# Patient Record
Sex: Female | Born: 1990 | Race: Black or African American | Hispanic: No | Marital: Single | Smoking: Former smoker
Health system: Southern US, Community
[De-identification: ages and names within clinical notes are randomized; demographics above are authoritative.]

## PROBLEM LIST (undated history)

## (undated) DIAGNOSIS — Z789 Other specified health status: Secondary | ICD-10-CM

## (undated) HISTORY — PX: ELBOW SURGERY: SHX618

---

## 2015-12-18 LAB — OB RESULTS CONSOLE HIV ANTIBODY (ROUTINE TESTING): HIV: NONREACTIVE

## 2015-12-18 LAB — OB RESULTS CONSOLE GC/CHLAMYDIA
CHLAMYDIA, DNA PROBE: NEGATIVE
Gonorrhea: NEGATIVE

## 2015-12-18 LAB — OB RESULTS CONSOLE ABO/RH: RH TYPE: POSITIVE

## 2015-12-18 LAB — OB RESULTS CONSOLE ANTIBODY SCREEN: Antibody Screen: NEGATIVE

## 2015-12-18 LAB — OB RESULTS CONSOLE RPR: RPR: NONREACTIVE

## 2015-12-18 LAB — OB RESULTS CONSOLE HEPATITIS B SURFACE ANTIGEN: Hepatitis B Surface Ag: NEGATIVE

## 2015-12-18 LAB — OB RESULTS CONSOLE RUBELLA ANTIBODY, IGM: Rubella: NON-IMMUNE/NOT IMMUNE

## 2016-06-19 LAB — OB RESULTS CONSOLE GBS: STREP GROUP B AG: NEGATIVE

## 2016-07-05 ENCOUNTER — Inpatient Hospital Stay (HOSPITAL_COMMUNITY): Payer: Medicaid Other | Admitting: Anesthesiology

## 2016-07-05 ENCOUNTER — Encounter (HOSPITAL_COMMUNITY): Payer: Self-pay | Admitting: *Deleted

## 2016-07-05 ENCOUNTER — Inpatient Hospital Stay (HOSPITAL_COMMUNITY)
Admission: AD | Admit: 2016-07-05 | Discharge: 2016-07-08 | DRG: 766 | Disposition: A | Discharge status: Home / Self Care | Payer: Medicaid Other | Source: Ambulatory Visit | Attending: Obstetrics and Gynecology | Admitting: Obstetrics and Gynecology

## 2016-07-05 DIAGNOSIS — O99214 Obesity complicating childbirth: Principal | ICD-10-CM | POA: Diagnosis present

## 2016-07-05 DIAGNOSIS — Z87891 Personal history of nicotine dependence: Secondary | ICD-10-CM

## 2016-07-05 DIAGNOSIS — O9902 Anemia complicating childbirth: Secondary | ICD-10-CM | POA: Diagnosis present

## 2016-07-05 DIAGNOSIS — Z8249 Family history of ischemic heart disease and other diseases of the circulatory system: Secondary | ICD-10-CM

## 2016-07-05 DIAGNOSIS — Z6838 Body mass index (BMI) 38.0-38.9, adult: Secondary | ICD-10-CM

## 2016-07-05 DIAGNOSIS — Z3A39 39 weeks gestation of pregnancy: Secondary | ICD-10-CM | POA: Diagnosis not present

## 2016-07-05 DIAGNOSIS — O4290 Premature rupture of membranes, unspecified as to length of time between rupture and onset of labor, unspecified weeks of gestation: Secondary | ICD-10-CM | POA: Diagnosis present

## 2016-07-05 HISTORY — DX: Other specified health status: Z78.9

## 2016-07-05 LAB — TYPE AND SCREEN
ABO/RH(D): O POS
Antibody Screen: NEGATIVE

## 2016-07-05 LAB — CBC
HCT: 31.9 % — ABNORMAL LOW (ref 36.0–46.0)
HEMOGLOBIN: 10.3 g/dL — AB (ref 12.0–15.0)
MCH: 23.6 pg — AB (ref 26.0–34.0)
MCHC: 32.3 g/dL (ref 30.0–36.0)
MCV: 73.2 fL — AB (ref 78.0–100.0)
PLATELETS: 246 10*3/uL (ref 150–400)
RBC: 4.36 MIL/uL (ref 3.87–5.11)
RDW: 14.3 % (ref 11.5–15.5)
WBC: 9.3 10*3/uL (ref 4.0–10.5)

## 2016-07-05 LAB — URINE MICROSCOPIC-ADD ON

## 2016-07-05 LAB — URINALYSIS, ROUTINE W REFLEX MICROSCOPIC
Bilirubin Urine: NEGATIVE
Glucose, UA: NEGATIVE mg/dL
Ketones, ur: 40 mg/dL — AB
NITRITE: NEGATIVE
PROTEIN: NEGATIVE mg/dL
SPECIFIC GRAVITY, URINE: 1.015 (ref 1.005–1.030)
pH: 6.5 (ref 5.0–8.0)

## 2016-07-05 LAB — RPR: RPR Ser Ql: NONREACTIVE

## 2016-07-05 LAB — ABO/RH: ABO/RH(D): O POS

## 2016-07-05 MED ORDER — ACETAMINOPHEN 325 MG PO TABS: 650.0000 mg | ORAL_TABLET | ORAL | Status: DC | PRN

## 2016-07-05 MED ORDER — OXYCODONE-ACETAMINOPHEN 5-325 MG PO TABS: 1.0000 | ORAL_TABLET | ORAL | Status: DC | PRN

## 2016-07-05 MED ORDER — OXYTOCIN 40 UNITS IN LACTATED RINGERS INFUSION - SIMPLE MED
1.0000 m[IU]/min | INTRAVENOUS | Status: DC
  Administered 2016-07-05: 2 m[IU]/min via INTRAVENOUS
  Filled 2016-07-05: qty 1000

## 2016-07-05 MED ORDER — NALBUPHINE HCL 10 MG/ML IJ SOLN
10.0000 mg | INTRAMUSCULAR | Status: DC | PRN
  Administered 2016-07-05 (×2): 10 mg via INTRAVENOUS
  Filled 2016-07-05 (×2): qty 1

## 2016-07-05 MED ORDER — DIPHENHYDRAMINE HCL 50 MG/ML IJ SOLN: 12.5000 mg | INTRAMUSCULAR | Status: DC | PRN

## 2016-07-05 MED ORDER — OXYTOCIN 40 UNITS IN LACTATED RINGERS INFUSION - SIMPLE MED: 2.5000 [IU]/h | INTRAVENOUS | Status: DC

## 2016-07-05 MED ORDER — MISOPROSTOL 50MCG HALF TABLET
50.0000 ug | ORAL_TABLET | ORAL | Status: DC
  Administered 2016-07-05: 50 ug via ORAL
  Filled 2016-07-05: qty 0.5

## 2016-07-05 MED ORDER — FENTANYL 2.5 MCG/ML BUPIVACAINE 1/10 % EPIDURAL INFUSION (WH - ANES)
14.0000 mL/h | INTRAMUSCULAR | Status: DC | PRN
  Administered 2016-07-05 – 2016-07-06 (×2): 14 mL/h via EPIDURAL
  Filled 2016-07-05 (×2): qty 125

## 2016-07-05 MED ORDER — PHENYLEPHRINE 40 MCG/ML (10ML) SYRINGE FOR IV PUSH (FOR BLOOD PRESSURE SUPPORT)
80.0000 ug | PREFILLED_SYRINGE | INTRAVENOUS | Status: DC | PRN
  Filled 2016-07-05: qty 5

## 2016-07-05 MED ORDER — ONDANSETRON HCL 4 MG/2ML IJ SOLN
4.0000 mg | Freq: Four times a day (QID) | INTRAMUSCULAR | Status: DC | PRN
  Filled 2016-07-05: qty 2

## 2016-07-05 MED ORDER — LACTATED RINGERS IV SOLN: 500.0000 mL | Freq: Once | INTRAVENOUS | Status: DC

## 2016-07-05 MED ORDER — SOD CITRATE-CITRIC ACID 500-334 MG/5ML PO SOLN
30.0000 mL | ORAL | Status: DC | PRN
  Filled 2016-07-05: qty 15

## 2016-07-05 MED ORDER — OXYTOCIN BOLUS FROM INFUSION: 500.0000 mL | INTRAVENOUS | Status: DC

## 2016-07-05 MED ORDER — FLEET ENEMA 7-19 GM/118ML RE ENEM: 1.0000 | ENEMA | RECTAL | Status: DC | PRN

## 2016-07-05 MED ORDER — LIDOCAINE HCL (PF) 1 % IJ SOLN
INTRAMUSCULAR | Status: DC | PRN
  Administered 2016-07-05 (×2): 4 mL via EPIDURAL

## 2016-07-05 MED ORDER — PHENYLEPHRINE 40 MCG/ML (10ML) SYRINGE FOR IV PUSH (FOR BLOOD PRESSURE SUPPORT): 80.0000 ug | PREFILLED_SYRINGE | INTRAVENOUS | Status: DC | PRN

## 2016-07-05 MED ORDER — LACTATED RINGERS IV SOLN
INTRAVENOUS | Status: DC
  Administered 2016-07-05 (×3): via INTRAVENOUS

## 2016-07-05 MED ORDER — OXYCODONE-ACETAMINOPHEN 5-325 MG PO TABS: 2.0000 | ORAL_TABLET | ORAL | Status: DC | PRN

## 2016-07-05 MED ORDER — PHENYLEPHRINE 40 MCG/ML (10ML) SYRINGE FOR IV PUSH (FOR BLOOD PRESSURE SUPPORT)
80.0000 ug | PREFILLED_SYRINGE | INTRAVENOUS | Status: DC | PRN
  Filled 2016-07-05: qty 10
  Filled 2016-07-05: qty 5

## 2016-07-05 MED ORDER — EPHEDRINE 5 MG/ML INJ: 10.0000 mg | INTRAVENOUS | Status: DC | PRN

## 2016-07-05 MED ORDER — EPHEDRINE 5 MG/ML INJ
10.0000 mg | INTRAVENOUS | Status: DC | PRN
  Filled 2016-07-05: qty 2

## 2016-07-05 MED ORDER — TERBUTALINE SULFATE 1 MG/ML IJ SOLN: 0.2500 mg | Freq: Once | INTRAMUSCULAR | Status: DC | PRN

## 2016-07-05 MED ORDER — LACTATED RINGERS IV SOLN
500.0000 mL | Freq: Once | INTRAVENOUS | Status: AC
  Administered 2016-07-05: 500 mL via INTRAVENOUS

## 2016-07-05 MED ORDER — LACTATED RINGERS IV SOLN: 500.0000 mL | INTRAVENOUS | Status: DC | PRN

## 2016-07-05 MED ORDER — LIDOCAINE HCL (PF) 1 % IJ SOLN: 30.0000 mL | INTRAMUSCULAR | Status: DC | PRN

## 2016-07-05 NOTE — Anesthesia Pain Management Evaluation Note (Signed)
  CRNA Pain Management Visit Note  Patient: Kimberly Day, 25 y.o., female  "Hello I am a member of the anesthesia team at [facility]. We have an anesthesia team available at all times to provide care throughout the hospital, including epidural management and anesthesia for C-section. I don't know your plan for the delivery whether it a natural birth, water birth, IV sedation, nitrous supplementation, doula or epidural, but we want to meet your pain goals."   1.Was your pain managed to your expectations on prior hospitalizations?   Yes   2.What is your expectation for pain management during this hospitalization?     Epidural  3.How can we help you reach that goal? epidural  Record the patient's initial score and the patient's pain goal.   Pain: 8  Pain Goal: 9 [facility] wants you to be able to say your pain was always managed very well.  Cephus Shelling 07/05/2016

## 2016-07-05 NOTE — Anesthesia Preprocedure Evaluation (Signed)
Anesthesia Evaluation  Patient identified by MRN, date of birth, ID band Patient awake    Reviewed: Allergy & Precautions, NPO status , Patient's Chart, lab work & pertinent test results  Airway Mallampati: II       Dental   Pulmonary former smoker,    breath sounds clear to auscultation       Cardiovascular negative cardio ROS   Rhythm:Regular Rate:Normal     Neuro/Psych negative neurological ROS  negative psych ROS   GI/Hepatic negative GI ROS, Neg liver ROS,   Endo/Other  negative endocrine ROS  Renal/GU negative Renal ROS  negative genitourinary   Musculoskeletal negative musculoskeletal ROS (+)   Abdominal   Peds negative pediatric ROS (+)  Hematology negative hematology ROS (+)   Anesthesia Other Findings   Reproductive/Obstetrics (+) Pregnancy                             Lab Results  Component Value Date   WBC 9.3 07/05/2016   HGB 10.3* 07/05/2016   HCT 31.9* 07/05/2016   MCV 73.2* 07/05/2016   PLT 246 07/05/2016   No results found for: INR, PROTIME   Anesthesia Physical Anesthesia Plan  ASA: III  Anesthesia Plan: Epidural   Post-op Pain Management:    Induction:   Airway Management Planned:   Additional Equipment:   Intra-op Plan:   Post-operative Plan:   Informed Consent: I have reviewed the patients History and Physical, chart, labs and discussed the procedure including the risks, benefits and alternatives for the proposed anesthesia with the patient or authorized representative who has indicated his/her understanding and acceptance.     Plan Discussed with:   Anesthesia Plan Comments:         Anesthesia Quick Evaluation

## 2016-07-05 NOTE — Progress Notes (Addendum)
  Subjective: Denies h/a, visual changes, epigastric pain or difficulty breathing.   Objective: BP 142/89 mmHg  Pulse 96  Temp(Src) 98.3 F (36.8 C) (Oral)  Resp 20  Ht 5\' 3"  (1.6 m)  Wt 97.523 kg (215 lb)  BMI 38.09 kg/m2 Today's Vitals   07/05/16 1725 07/05/16 1803 07/05/16 1836 07/05/16 1855  BP: 126/80  133/84 142/89  Pulse: 89  91 96  Temp:   98.3 F (36.8 C)   TempSrc:   Oral   Resp: 20 18 20 20   Height:      Weight:      PainSc:  9      FHT: BL 140 w/ moderate variability, +accels, earlys UC:   irregular, every 3-4 minutes SVE:   Dilation: 4 Effacement (%): 80 Station: -1 Exam by:: Belenda Cruise RNC @ 18:03 Pitocin at 18 mU/min  Assessment:  IUP at 39.0 wks SROM x 35 1/2 hrs; no s/s of infection Latent labor Elevated BPs; may be pain related Cat 1 FHRT  Plan: Obtain preE labs/urine PCR as needed Place IUPC at next exam  Sherre Scarlet CNM 07/05/2016, 7:13 PM

## 2016-07-05 NOTE — Anesthesia Procedure Notes (Signed)
Epidural Patient location during procedure: OB Start time: 07/05/2016 9:07 PM End time: 07/05/2016 9:14 PM  Staffing Anesthesiologist: Shona Simpson D Performed by: anesthesiologist   Preanesthetic Checklist Completed: patient identified, site marked, surgical consent, pre-op evaluation, timeout performed, IV checked, risks and benefits discussed and monitors and equipment checked  Epidural Patient position: sitting Prep: ChloraPrep Patient monitoring: heart rate, continuous pulse ox and blood pressure Approach: midline Location: L3-L4 Injection technique: LOR saline  Needle:  Needle type: Tuohy  Needle gauge: 17 G Needle length: 9 cm Catheter type: closed end flexible Catheter size: 20 Guage Test dose: negative and 1.5% lidocaine  Assessment Events: blood not aspirated, injection not painful, no injection resistance and no paresthesia  Additional Notes LOR @ 6  Patient identified. Risks/Benefits/Options discussed with patient including but not limited to bleeding, infection, nerve damage, paralysis, failed block, incomplete pain control, headache, blood pressure changes, nausea, vomiting, reactions to medications, itching and postpartum back pain. Confirmed with bedside nurse the patient's most recent platelet count. Confirmed with patient that they are not currently taking any anticoagulation, have any bleeding history or any family history of bleeding disorders. Patient expressed understanding and wished to proceed. All questions were answered. Sterile technique was used throughout the entire procedure. Please see nursing notes for vital signs. Test dose was given through epidural catheter and negative prior to continuing to dose epidural or start infusion. Warning signs of high block given to the patient including shortness of breath, tingling/numbness in hands, complete motor block, or any concerning symptoms with instructions to call for help. Patient was given instructions on  fall risk and not to get out of bed. All questions and concerns addressed with instructions to call with any issues or inadequate analgesia.    Reason for block:procedure for pain

## 2016-07-05 NOTE — Progress Notes (Signed)
OB PN:  S: Pt feeling contractions, but manageable.  S/p IV medication  O: BP 123/71 mmHg  Pulse 95  Temp(Src) 97.4 F (36.3 C) (Oral)  Resp 18  Ht 5\' 3"  (1.6 m)  Wt 97.523 kg (215 lb)  BMI 38.09 kg/m2  FHT: 135bpm, moderate variablity, + accels, no decels Toco: irregular SVE: deferred, last exam @ 1130: 1-2/50/-3  A/P: 25 y.o. G1P0 @ [redacted]w[redacted]d admitted for PROM 1. FWB: Cat. I 2. Labor: continue Pit per protocol Pain: IV medication or epidural upon maternal request GBS: negative  Myna Hidalgo, DO [phone] (pager) [phone] (office)

## 2016-07-05 NOTE — MAU Note (Signed)
Contractions since 0030. Leaking fld off and on all day Friday. Sometimes watery fld and other times mucous. Cerix closed last exam

## 2016-07-05 NOTE — Progress Notes (Addendum)
  Subjective: Becoming more uncomfortable; desires cervical exam and epidural if no change. Denies h/a, visual changes, epigastric pain or difficulty breathing.    Objective: BP 142/89 mmHg  Pulse 96  Temp(Src) 98.3 F (36.8 C) (Oral)  Resp 20  Ht 5\' 3"  (1.6 m)  Wt 97.523 kg (215 lb)  BMI 38.09 kg/m2 Today's Vitals   07/05/16 1803 07/05/16 1836 07/05/16 1855 07/05/16 1949  BP:  133/84 142/89   Pulse:  91 96   Temp:  98.3 F (36.8 C)    TempSrc:  Oral    Resp: 18 20 20    Height:      Weight:      PainSc: 9    9    Maternal pulse > 100 bpm on several occasions.  FHT: BL 140 w/ mod variability, +accels, no decels UC: regular, q 5 min SVE: 4/80/-1 @ 20:18 Pitocin at 18 mU/min IUPC placed w/ ease @ 20:18  Assessment:  IUP at term SROM  X 36 hrs; no s/s of infection Slight elevations in MHR - monitor closely Latent labor GBS neg  Plan: Epidural now; expect decrease in BP and pulse Anticipate SVD Consult as indicated  Sherre Scarlet CNM 07/05/2016, 8:24 PM

## 2016-07-05 NOTE — H&P (Signed)
Kimberly Day is a 25 y.o. female G1P0 @ 39.0 wks (as dated by a 12.2 wk u/s) presents for ROM (clear fluid) since 8 am yesterday morning. +FM and mild, irregular ctxs that started just prior to arrival. Denies VB.  Antepartum course c/b: Obesity (BMI 38.3) Anemia (on daily Iron) FH of breast dz  Maternal Medical History:  Reason for admission: Rupture of membranes.   Contractions: Onset was 1-2 hours ago.   Frequency: irregular.   Perceived severity is moderate.    Fetal activity: Perceived fetal activity is normal.   Last perceived fetal movement was within the past hour.    Prenatal complications: No bleeding.   Prenatal Complications - Diabetes: none.    OB History    Gravida Para Term Preterm AB TAB SAB Ectopic Multiple Living   1              Past Medical History  Diagnosis Date  . Medical history non-contributory    Past Surgical History  Procedure Laterality Date  . Elbow surgery Right    Family History: family history includes Hypertension in her father. Social History:  reports that she has quit smoking. Her smoking use included Cigarettes. She smoked 0.25 packs per day. She does not have any smokeless tobacco history on file. She reports that she does not drink alcohol or use illicit drugs.   Prenatal Transfer Tool  Maternal Diabetes: No Genetic Screening: Normal Maternal Ultrasounds/Referrals: Normal Fetal Ultrasounds or other Referrals:  None Maternal Substance Abuse:  No Significant Maternal Medications:  Meds include: Other: PNV, CitraNatal 90 DHA (algal oil), Concept DHA, Ferrous Sulfate Significant Maternal Lab Results:  Hbg 10.7at 28 wks Other Comments:  Tdap 05/22/16; flu 12/25/15  Review of Systems  Constitutional: Negative for fever, chills, weight loss, malaise/fatigue and diaphoresis.  Eyes: Negative for blurred vision, double vision and photophobia.  Gastrointestinal: Negative for heartburn, vomiting, abdominal pain, diarrhea and  constipation.  Genitourinary: Negative for dysuria, urgency, frequency, hematuria and flank pain.  Neurological: Negative for dizziness, weakness and headaches.    Dilation: 1 Effacement (%): 50 Station: Ballotable Exam by:: Sherre Scarlet CNM Blood pressure 127/81, pulse 79, temperature 98.2 F (36.8 C), temperature source Oral, resp. rate 16, height 5\' 3"  (1.6 m), weight 97.523 kg (215 lb). Maternal Exam:  Uterine Assessment: Contraction strength is firm.  Contraction duration is 35 seconds. Contraction frequency is irregular.   Abdomen: Fundal height is CWD.   Estimated fetal weight is 7 1/4 lbs.   Fetal presentation: vertex  Introitus: Normal vulva. Normal vagina.  Ferning test: positive.  Amniotic fluid character: clear.  Pelvis: adequate for delivery.   Cervix: Cervix evaluated by digital exam.     Fetal Exam Fetal Monitor Review: Mode: fetoscope.   Variability: marked (>25 bpm).   Pattern: no decelerations and early decelerations.       Physical Exam  Constitutional: She appears well-developed and well-nourished. No distress.  HENT:  Head: Normocephalic and atraumatic.  Neck: Normal range of motion. Neck supple.  Cardiovascular: Normal rate, regular rhythm and intact distal pulses.  Exam reveals no gallop and no friction rub.   No murmur heard. Respiratory: Effort normal and breath sounds normal. No respiratory distress.  GI: Soft. Bowel sounds are normal. She exhibits no distension and no mass. There is no tenderness. There is no rebound and no guarding.  Musculoskeletal: Normal range of motion.  Skin: Skin is warm.   Cephalic by Leopold's and limited bedside u/s.  Prenatal labs: ABO, Rh:  O/Positive/-- (01/03 0000) Antibody: Negative (01/03 0000) Rubella: Immune (01/03 0000) RPR: Nonreactive (01/03 0000)  HBsAg: Negative (01/03 0000)  HIV: Non-reactive (01/03 0000)  GBS: Negative (07/06 0000)   Assessment: IUP at 39.0 wks SROM x 23 hours; no s/s of  infection GBS neg Cat 1 FHRT Anemia  Plan: Admit to [facility]. Routine [facility] orders. Pain med/epidural prn. Reviewed options of induction to include po Cytotec and Pitocin. In light of unfavorable cvx, will begin w/ po Cytotec. Reviewed plan of care with patient & mother. They seem to understand these risks and wish to proceed. Monitor for signs of infection. Expect progress and SVD. Dr. Charlotta Newton to assume care at 0700.   Sherre Scarlet 07/05/2016, 7:00 AM

## 2016-07-05 NOTE — Progress Notes (Signed)
OB PN:  S: Pt resting comfortably, no acute complaints  O: BP 121/81 mmHg  Pulse 82  Temp(Src) 98 F (36.7 C) (Oral)  Resp 16  Ht 5\' 3"  (1.6 m)  Wt 97.523 kg (215 lb)  BMI 38.09 kg/m2  FHT: 135bpm, moderate variablity, + accels, NO decels Toco: irregular SVE: deferred, vertex by [place]  A/P: 25 y.o. G1P0 @ [redacted]w[redacted]d admitted for PROM 1. FWB: Cat. I 2. Labor: plan for po cytotec x 1 then transition to Pit Pain: Stadol or epidural upon maternal request GBS: negative  Myna Hidalgo, DO [phone] (pager) [phone] (office)

## 2016-07-06 ENCOUNTER — Encounter (HOSPITAL_COMMUNITY): Payer: Self-pay | Admitting: *Deleted

## 2016-07-06 ENCOUNTER — Encounter (HOSPITAL_COMMUNITY)
Admission: AD | Disposition: A | Discharge status: Home / Self Care | Payer: Self-pay | Source: Ambulatory Visit | Attending: Obstetrics and Gynecology

## 2016-07-06 SURGERY — Surgical Case
Anesthesia: *Unknown

## 2016-07-06 SURGERY — Surgical Case
Anesthesia: Epidural

## 2016-07-06 MED ORDER — LACTATED RINGERS IV SOLN
INTRAVENOUS | Status: DC | PRN
  Administered 2016-07-06 (×2): via INTRAVENOUS

## 2016-07-06 MED ORDER — ONDANSETRON HCL 4 MG/2ML IJ SOLN
INTRAMUSCULAR | Status: DC | PRN
  Administered 2016-07-06: 4 mg via INTRAVENOUS

## 2016-07-06 MED ORDER — SODIUM BICARBONATE 8.4 % IV SOLN
INTRAVENOUS | Status: DC | PRN
  Administered 2016-07-06: 5 mL via EPIDURAL

## 2016-07-06 MED ORDER — NALBUPHINE HCL 10 MG/ML IJ SOLN: 5.0000 mg | INTRAMUSCULAR | Status: DC | PRN

## 2016-07-06 MED ORDER — ONDANSETRON HCL 4 MG/2ML IJ SOLN
4.0000 mg | Freq: Three times a day (TID) | INTRAMUSCULAR | Status: DC | PRN
  Administered 2016-07-06: 4 mg via INTRAVENOUS

## 2016-07-06 MED ORDER — MEPERIDINE HCL 25 MG/ML IJ SOLN: 6.2500 mg | INTRAMUSCULAR | Status: DC | PRN

## 2016-07-06 MED ORDER — ACETAMINOPHEN 325 MG PO TABS: 650.0000 mg | ORAL_TABLET | ORAL | Status: DC | PRN

## 2016-07-06 MED ORDER — MEPERIDINE HCL 25 MG/ML IJ SOLN
INTRAMUSCULAR | Status: AC
  Filled 2016-07-06: qty 1

## 2016-07-06 MED ORDER — DIPHENHYDRAMINE HCL 25 MG PO CAPS
25.0000 mg | ORAL_CAPSULE | ORAL | Status: DC | PRN
  Filled 2016-07-06 (×2): qty 1

## 2016-07-06 MED ORDER — OXYTOCIN 10 UNIT/ML IJ SOLN
INTRAVENOUS | Status: DC | PRN
  Administered 2016-07-06: 40 [IU] via INTRAVENOUS

## 2016-07-06 MED ORDER — ZOLPIDEM TARTRATE 5 MG PO TABS: 5.0000 mg | ORAL_TABLET | Freq: Every evening | ORAL | Status: DC | PRN

## 2016-07-06 MED ORDER — SCOPOLAMINE 1 MG/3DAYS TD PT72
MEDICATED_PATCH | TRANSDERMAL | Status: AC
  Filled 2016-07-06: qty 1

## 2016-07-06 MED ORDER — KETOROLAC TROMETHAMINE 30 MG/ML IJ SOLN
30.0000 mg | Freq: Four times a day (QID) | INTRAMUSCULAR | Status: AC | PRN
  Administered 2016-07-06: 30 mg via INTRAMUSCULAR

## 2016-07-06 MED ORDER — NALBUPHINE HCL 10 MG/ML IJ SOLN: 5.0000 mg | Freq: Once | INTRAMUSCULAR | Status: DC | PRN

## 2016-07-06 MED ORDER — SIMETHICONE 80 MG PO CHEW: 80.0000 mg | CHEWABLE_TABLET | ORAL | Status: DC | PRN

## 2016-07-06 MED ORDER — CEFAZOLIN SODIUM-DEXTROSE 2-4 GM/100ML-% IV SOLN: 2.0000 g | Freq: Once | INTRAVENOUS | Status: DC

## 2016-07-06 MED ORDER — DIPHENHYDRAMINE HCL 25 MG PO CAPS
25.0000 mg | ORAL_CAPSULE | Freq: Four times a day (QID) | ORAL | Status: DC | PRN
  Administered 2016-07-06: 25 mg via ORAL

## 2016-07-06 MED ORDER — OXYTOCIN 10 UNIT/ML IJ SOLN
INTRAMUSCULAR | Status: AC
  Filled 2016-07-06: qty 4

## 2016-07-06 MED ORDER — KETOROLAC TROMETHAMINE 30 MG/ML IJ SOLN
INTRAMUSCULAR | Status: AC
  Filled 2016-07-06: qty 1

## 2016-07-06 MED ORDER — PHENYLEPHRINE HCL 10 MG/ML IJ SOLN
INTRAMUSCULAR | Status: DC | PRN
  Administered 2016-07-06 (×2): 80 ug via INTRAVENOUS
  Administered 2016-07-06: 40 ug via INTRAVENOUS

## 2016-07-06 MED ORDER — WITCH HAZEL-GLYCERIN EX PADS: 1.0000 "application " | MEDICATED_PAD | CUTANEOUS | Status: DC | PRN

## 2016-07-06 MED ORDER — SENNOSIDES-DOCUSATE SODIUM 8.6-50 MG PO TABS
2.0000 | ORAL_TABLET | ORAL | Status: DC
  Administered 2016-07-06 – 2016-07-07 (×2): 2 via ORAL
  Filled 2016-07-06 (×2): qty 2

## 2016-07-06 MED ORDER — METOCLOPRAMIDE HCL 5 MG/ML IJ SOLN
INTRAMUSCULAR | Status: DC | PRN
  Administered 2016-07-06: 10 mg via INTRAVENOUS

## 2016-07-06 MED ORDER — SIMETHICONE 80 MG PO CHEW
80.0000 mg | CHEWABLE_TABLET | ORAL | Status: DC
  Administered 2016-07-06 – 2016-07-07 (×2): 80 mg via ORAL
  Filled 2016-07-06 (×2): qty 1

## 2016-07-06 MED ORDER — CEFAZOLIN SODIUM-DEXTROSE 2-4 GM/100ML-% IV SOLN
INTRAVENOUS | Status: AC
Start: 2016-07-06 — End: 2016-07-06
  Filled 2016-07-06: qty 100

## 2016-07-06 MED ORDER — SIMETHICONE 80 MG PO CHEW
80.0000 mg | CHEWABLE_TABLET | Freq: Three times a day (TID) | ORAL | Status: DC
  Administered 2016-07-07 – 2016-07-08 (×3): 80 mg via ORAL
  Filled 2016-07-06 (×3): qty 1

## 2016-07-06 MED ORDER — NALOXONE HCL 2 MG/2ML IJ SOSY
1.0000 ug/kg/h | PREFILLED_SYRINGE | INTRAMUSCULAR | Status: DC | PRN
Start: 2016-07-06 — End: 2016-07-08
  Filled 2016-07-06: qty 2

## 2016-07-06 MED ORDER — SCOPOLAMINE 1 MG/3DAYS TD PT72: 1.0000 | MEDICATED_PATCH | Freq: Once | TRANSDERMAL | Status: DC

## 2016-07-06 MED ORDER — OXYTOCIN 40 UNITS IN LACTATED RINGERS INFUSION - SIMPLE MED: 2.5000 [IU]/h | INTRAVENOUS | Status: AC

## 2016-07-06 MED ORDER — SODIUM CHLORIDE 0.9% FLUSH: 3.0000 mL | INTRAVENOUS | Status: DC | PRN

## 2016-07-06 MED ORDER — MEPERIDINE HCL 25 MG/ML IJ SOLN
INTRAMUSCULAR | Status: DC | PRN
  Administered 2016-07-06: 12.5 mg via INTRAVENOUS

## 2016-07-06 MED ORDER — DEXAMETHASONE SODIUM PHOSPHATE 4 MG/ML IJ SOLN
INTRAMUSCULAR | Status: AC
  Filled 2016-07-06: qty 1

## 2016-07-06 MED ORDER — DIBUCAINE 1 % RE OINT: 1.0000 "application " | TOPICAL_OINTMENT | RECTAL | Status: DC | PRN

## 2016-07-06 MED ORDER — MENTHOL 3 MG MT LOZG: 1.0000 | LOZENGE | OROMUCOSAL | Status: DC | PRN

## 2016-07-06 MED ORDER — KETOROLAC TROMETHAMINE 30 MG/ML IJ SOLN: 30.0000 mg | Freq: Four times a day (QID) | INTRAMUSCULAR | Status: AC | PRN

## 2016-07-06 MED ORDER — PRENATAL MULTIVITAMIN CH
1.0000 | ORAL_TABLET | Freq: Every day | ORAL | Status: DC
  Administered 2016-07-07 – 2016-07-08 (×2): 1 via ORAL
  Filled 2016-07-06 (×2): qty 1

## 2016-07-06 MED ORDER — CEFAZOLIN SODIUM-DEXTROSE 2-3 GM-% IV SOLR
INTRAVENOUS | Status: DC | PRN
  Administered 2016-07-06: 2 g via INTRAVENOUS

## 2016-07-06 MED ORDER — LIDOCAINE-EPINEPHRINE 2 %-1:100000 IJ SOLN
INTRAMUSCULAR | Status: DC | PRN
  Administered 2016-07-06: 5 mL via INTRADERMAL

## 2016-07-06 MED ORDER — MORPHINE SULFATE (PF) 0.5 MG/ML IJ SOLN
INTRAMUSCULAR | Status: DC | PRN
  Administered 2016-07-06: 1 mg via INTRAVENOUS
  Administered 2016-07-06: 4 mg via EPIDURAL

## 2016-07-06 MED ORDER — LACTATED RINGERS IV SOLN
INTRAVENOUS | Status: DC
  Administered 2016-07-06 (×2): via INTRAVENOUS

## 2016-07-06 MED ORDER — MORPHINE SULFATE (PF) 0.5 MG/ML IJ SOLN
INTRAMUSCULAR | Status: AC
  Filled 2016-07-06: qty 10

## 2016-07-06 MED ORDER — FAMOTIDINE IN NACL 20-0.9 MG/50ML-% IV SOLN
20.0000 mg | Freq: Once | INTRAVENOUS | Status: AC
  Administered 2016-07-06: 20 mg via INTRAVENOUS
  Filled 2016-07-06: qty 50

## 2016-07-06 MED ORDER — NALOXONE HCL 0.4 MG/ML IJ SOLN: 0.4000 mg | INTRAMUSCULAR | Status: DC | PRN

## 2016-07-06 MED ORDER — ONDANSETRON HCL 4 MG/2ML IJ SOLN
INTRAMUSCULAR | Status: AC
  Filled 2016-07-06: qty 2

## 2016-07-06 MED ORDER — SCOPOLAMINE 1 MG/3DAYS TD PT72
MEDICATED_PATCH | TRANSDERMAL | Status: DC | PRN
  Administered 2016-07-06: 1 via TRANSDERMAL

## 2016-07-06 MED ORDER — FENTANYL CITRATE (PF) 100 MCG/2ML IJ SOLN
INTRAMUSCULAR | Status: DC | PRN
Start: 2016-07-06 — End: 2016-07-06
  Administered 2016-07-06: 100 ug via INTRAVENOUS

## 2016-07-06 MED ORDER — DEXAMETHASONE SODIUM PHOSPHATE 4 MG/ML IJ SOLN
INTRAMUSCULAR | Status: DC | PRN
  Administered 2016-07-06: 4 mg via INTRAVENOUS

## 2016-07-06 MED ORDER — PROMETHAZINE HCL 25 MG/ML IJ SOLN: 6.2500 mg | INTRAMUSCULAR | Status: DC | PRN

## 2016-07-06 MED ORDER — DIPHENHYDRAMINE HCL 50 MG/ML IJ SOLN: 12.5000 mg | INTRAMUSCULAR | Status: DC | PRN

## 2016-07-06 MED ORDER — LACTATED RINGERS IV SOLN
INTRAVENOUS | Status: DC
  Administered 2016-07-06: 05:00:00 via INTRAVENOUS

## 2016-07-06 MED ORDER — IBUPROFEN 600 MG PO TABS
600.0000 mg | ORAL_TABLET | Freq: Four times a day (QID) | ORAL | Status: DC
  Administered 2016-07-06 – 2016-07-08 (×9): 600 mg via ORAL
  Filled 2016-07-06 (×9): qty 1

## 2016-07-06 MED ORDER — FENTANYL CITRATE (PF) 100 MCG/2ML IJ SOLN
INTRAMUSCULAR | Status: AC
  Filled 2016-07-06: qty 2

## 2016-07-06 MED ORDER — FENTANYL CITRATE (PF) 100 MCG/2ML IJ SOLN: 25.0000 ug | INTRAMUSCULAR | Status: DC | PRN

## 2016-07-06 MED ORDER — COCONUT OIL OIL: 1.0000 "application " | TOPICAL_OIL | Status: DC | PRN

## 2016-07-06 MED ORDER — TETANUS-DIPHTH-ACELL PERTUSSIS 5-2.5-18.5 LF-MCG/0.5 IM SUSP: 0.5000 mL | Freq: Once | INTRAMUSCULAR | Status: DC

## 2016-07-06 MED ORDER — CEFAZOLIN SODIUM-DEXTROSE 2-4 GM/100ML-% IV SOLN
2.0000 g | INTRAVENOUS | Status: DC
  Filled 2016-07-06: qty 100

## 2016-07-06 SURGICAL SUPPLY — 36 items
BARRIER ADHS 3X4 INTERCEED (GAUZE/BANDAGES/DRESSINGS) ×2 IMPLANT
BENZOIN TINCTURE PRP APPL 2/3 (GAUZE/BANDAGES/DRESSINGS) ×2 IMPLANT
CHLORAPREP W/TINT 26ML (MISCELLANEOUS) ×2 IMPLANT
CLAMP CORD UMBIL (MISCELLANEOUS) IMPLANT
CLOTH BEACON ORANGE TIMEOUT ST (SAFETY) ×2 IMPLANT
DRSG OPSITE POSTOP 4X10 (GAUZE/BANDAGES/DRESSINGS) ×2 IMPLANT
ELECT REM PT RETURN 9FT ADLT (ELECTROSURGICAL) ×2
ELECTRODE REM PT RTRN 9FT ADLT (ELECTROSURGICAL) ×1 IMPLANT
EXTRACTOR VACUUM KIWI (MISCELLANEOUS) IMPLANT
GLOVE BIOGEL PI IND STRL 6.5 (GLOVE) ×1 IMPLANT
GLOVE BIOGEL PI IND STRL 7.0 (GLOVE) ×2 IMPLANT
GLOVE BIOGEL PI INDICATOR 6.5 (GLOVE) ×1
GLOVE BIOGEL PI INDICATOR 7.0 (GLOVE) ×2
GLOVE ECLIPSE 6.5 STRL STRAW (GLOVE) ×2 IMPLANT
GOWN STRL REUS W/TWL LRG LVL3 (GOWN DISPOSABLE) ×6 IMPLANT
KIT ABG SYR 3ML LUER SLIP (SYRINGE) IMPLANT
LIQUID BAND (GAUZE/BANDAGES/DRESSINGS) IMPLANT
NEEDLE HYPO 25X5/8 SAFETYGLIDE (NEEDLE) IMPLANT
NS IRRIG 1000ML POUR BTL (IV SOLUTION) ×2 IMPLANT
PACK C SECTION WH (CUSTOM PROCEDURE TRAY) ×2 IMPLANT
PAD ABD 7.5X8 STRL (GAUZE/BANDAGES/DRESSINGS) ×2 IMPLANT
PAD OB MATERNITY 4.3X12.25 (PERSONAL CARE ITEMS) ×2 IMPLANT
PENCIL SMOKE EVAC W/HOLSTER (ELECTROSURGICAL) ×2 IMPLANT
RTRCTR C-SECT PINK 25CM LRG (MISCELLANEOUS) ×2 IMPLANT
STRIP CLOSURE SKIN 1/2X4 (GAUZE/BANDAGES/DRESSINGS) ×2 IMPLANT
SUT PLAIN 0 NONE (SUTURE) IMPLANT
SUT PLAIN 2 0 XLH (SUTURE) IMPLANT
SUT VIC AB 0 CT1 27 (SUTURE) ×2
SUT VIC AB 0 CT1 27XBRD ANBCTR (SUTURE) ×2 IMPLANT
SUT VIC AB 0 CTX 36 (SUTURE) ×3
SUT VIC AB 0 CTX36XBRD ANBCTRL (SUTURE) ×3 IMPLANT
SUT VIC AB 2-0 CT1 27 (SUTURE) ×1
SUT VIC AB 2-0 CT1 TAPERPNT 27 (SUTURE) ×1 IMPLANT
SUT VIC AB 4-0 KS 27 (SUTURE) ×2 IMPLANT
TOWEL OR 17X24 6PK STRL BLUE (TOWEL DISPOSABLE) ×2 IMPLANT
TRAY FOLEY CATH SILVER 14FR (SET/KITS/TRAYS/PACK) IMPLANT

## 2016-07-06 NOTE — Addendum Note (Signed)
Addendum  created 07/06/16 1318 by Algis Greenhouse, CRNA   Charge Capture section accepted, Sign clinical note

## 2016-07-06 NOTE — Lactation Note (Signed)
This note was copied from a baby's chart. Lactation Consultation Note  Patient Name: Kimberly Day Today's Date: 07/06/2016 Reason for consult: Initial assessment RN had assisted Mom with latching baby to left breast. Baby demonstrating good suckling bursts with swallowing motions noted. Mom denies discomfort. Mom reports baby is not latching to right breast. Demonstrated hand expression and using hand pump to help with latch. Basic teaching reviewed, encouraged to continue to BF with feeding ques. Lactation brochure left for review, advised of OP services and support group. LC left phone number for Mom to call with next feeding for assist with latch on right breast.   Maternal Data Has patient been taught Hand Expression?: Yes Does the patient have breastfeeding experience prior to this delivery?: No  Feeding Feeding Type: Breast Fed  LATCH Score/Interventions Latch: Repeated attempts needed to sustain latch, nipple held in mouth throughout feeding, stimulation needed to elicit sucking reflex. Intervention(s): Adjust position;Assist with latch;Breast compression  Audible Swallowing: None  Type of Nipple: Flat (erect with stim) Intervention(s): Hand pump  Comfort (Breast/Nipple): Soft / non-tender     Hold (Positioning): Assistance needed to correctly position infant at breast and maintain latch. Intervention(s): Breastfeeding basics reviewed;Support Pillows;Skin to skin  LATCH Score: 5  Lactation Tools Discussed/Used WIC Program: Yes   Consult Status Consult Status: Follow-up Date: 07/06/16 Follow-up type: In-patient    Alfred Levins 07/06/2016, 5:37 PM

## 2016-07-06 NOTE — Lactation Note (Signed)
This note was copied from a baby's chart. Lactation Consultation Note  Patient Name: Kimberly Day Date: 07/06/2016 Reason for consult: Follow-up assessment Mom called for assist with latching baby to right breast. Mom's right nipple is flat, Mom pre-pumped with hand pump, nipple compressible. After several attempts in laid back position using breast compression baby was able to latch and sustain the latch demonstrating good suckling bursts with some swallows noted. Mom denied discomfort. Mom has colostrum present with hand expression/pre-pumping. Mom to continue to BF with feeding ques. Alternate breast each feeding, ask for assist as needed to help baby latch.   Maternal Data    Feeding Feeding Type: Breast Fed  LATCH Score/Interventions Latch: Repeated attempts needed to sustain latch, nipple held in mouth throughout feeding, stimulation needed to elicit sucking reflex. (right breast) Intervention(s): Adjust position;Assist with latch;Breast massage;Breast compression  Audible Swallowing: A few with stimulation  Type of Nipple: Flat (right nipple flat/compressible) Intervention(s): Hand pump  Comfort (Breast/Nipple): Soft / non-tender     Hold (Positioning): Assistance needed to correctly position infant at breast and maintain latch.  LATCH Score: 6  Lactation Tools Discussed/Used Tools: Pump Breast pump type: Manual   Consult Status Consult Status: Follow-up Date: 07/07/16 Follow-up type: In-patient    Alfred Levins 07/06/2016, 11:23 PM

## 2016-07-06 NOTE — Op Note (Signed)
PreOp Diagnosis:  1) Intrauterine pregnancy @ [redacted]w[redacted]d  2) Arrest of dilation PostOp Diagnosis: same Procedure: Primary LTCS Surgeon: Dr. Myna Hidalgo Assistant: Sherre Scarlet, CNM Anesthesia: epidural Complications: none EBL: 600cc UOP: 50cc Fluids: 2200cc  Findings: Female infant from vertex presentation, normal uterus, tubes and ovaries bilaterally.  PROCEDURE:  Informed consent was obtained from the patient with risks, benefits, complications, treatment options, and expected outcomes discussed with the patient.  The patient concurred with the proposed plan, giving informed consent with form signed.   The patient was taken to Operating Room, and identified with the procedure verified as C-Section Delivery with Time Out. The patient was prepped and draped in the usual sterile fashion. A Pfannenstiel incision was made and carried down through the subcutaneous tissue to the fascia. The fascia was incised in the midline and extended transversely. The superior aspect of the fascial incision was grasped with Kochers elevated and the underlying muscle dissected off. The inferior aspect of the facial incision was in similar fashion, grasped elevated and rectus muscles dissected off. The peritoneum was identified and entered. Peritoneal incision was extended longitudinally. The Alexis retractor was inserted.  The utero-vesical peritoneal reflection was identified and incised transversely with the [facility] scissors, the incision extended laterally, the bladder flap created digitally. A low transverse uterine incision was made and the infants head delivered atraumatically. After the umbilical cord was clamped and cut cord blood was obtained for evaluation.   The placenta was removed intact and appeared normal. The uterine outline, tubes and ovaries appeared normal. The uterine incision was closed with running locked sutures of 0 Vicryl and a second layer of the same stitch was used in an imbricating  fashion.  Excellent hemostasis was obtained.  The pericolic gutters were then cleared of all clots and debris. Interceed was placed over the incision.  Peritoneum was closed in a running fashion. The fascia was then reapproximated with running sutures of 0 Vicryl.  The skin was closed with 4-0 vicryl in a subcuticular fashion.  Instrument, sponge, and needle counts were correct prior the abdominal closure and at the conclusion of the case. The patient was taken to recovery in stable condition.  Myna Hidalgo, DO [phone] (pager) [phone] (office)

## 2016-07-06 NOTE — Progress Notes (Signed)
At bedside to discuss primary C-section for arrest of dilation.  Despite Pitocin and IUPC with monitoring of contractions, the patient has not had any further cervical dilation for over 6 hours.  Risk benefits and alternatives of cesarean section were discussed with the patient including but not limited to infection, bleeding, damage to bowel , bladder and baby with the need for further surgery. Pt voiced understanding and desires to proceed.   Myna Hidalgo, DO [phone] (pager) [phone] (office)

## 2016-07-06 NOTE — Progress Notes (Addendum)
  Subjective: Feels rested. Has occasional vaginal pressure, o/w comfortable w/ epidural. +FM. No VB. Continues to leak clear fluid.  Objective: BP 117/68   Pulse 92   Temp 98.4 F (36.9 C) (Oral)   Resp 18   Ht 5\' 3"  (1.6 m)   Wt 97.5 kg (215 lb)   SpO2 100%   BMI 38.09 kg/m  No intake/output data recorded. No intake/output data recorded. Today's Vitals   07/06/16 0331 07/06/16 0401 07/06/16 0431 07/06/16 0501  BP: 128/64 125/67 115/62 117/68  Pulse: 92 83 86 92  Resp:      Temp:      TempSrc:      SpO2:      Weight:      Height:      PainSc:       FHT: Category 1 at present UC:   irregular, every 3-4 minutes SVE: 4/thick/high    Pitocin at 19 mU/min - off at decision time. Reached 30 mU/min, then reduced in half, continuing 2x2 regimen. Max MVUs 150.  Assessment:  IUP at 39.1 wks. FTP-arrest of dilation Approaching 48-hr SROM, however no s/s of infection. Max temp 99.6. GBS neg.  Plan: Contacted Dr. Charlotta Newton re: status - concurs w/ proceeding w/ c-section. Risks, Benefits, Alternatives including but not limited to bleeding, infection and injury were discussed with the patient. Patient verbalized understanding and consent signed and witnessed. Ancef 2 grams to OR. Prep for surgery.  Sherre Scarlet CNM 07/06/2016, 5:43 AM

## 2016-07-06 NOTE — Anesthesia Postprocedure Evaluation (Signed)
Anesthesia Post Note  Patient: Kimberly Day  Procedure(s) Performed: Procedure(s) (LRB): CESAREAN SECTION (N/A)  Patient location during evaluation: Mother Baby Anesthesia Type: Spinal Level of consciousness: awake Pain management: satisfactory to patient Vital Signs Assessment: post-procedure vital signs reviewed and stable Respiratory status: spontaneous breathing Cardiovascular status: stable Anesthetic complications: no     Last Vitals:  Vitals:   07/06/16 1044 07/06/16 1215  BP: 132/71 120/72  Pulse:    Resp: 20 20  Temp: 37.2 C 36.7 C    Last Pain:  Vitals:   07/06/16 1215  TempSrc: Oral  PainSc:    Pain Goal: Patients Stated Pain Goal: 0 (07/05/16 0600)               Cephus Shelling

## 2016-07-06 NOTE — Anesthesia Postprocedure Evaluation (Signed)
Anesthesia Post Note  Patient: Kimberly Day  Procedure(s) Performed: Procedure(s) (LRB): CESAREAN SECTION (N/A)  Patient location during evaluation: PACU Anesthesia Type: Epidural Level of consciousness: oriented and awake and alert Pain management: pain level controlled Vital Signs Assessment: post-procedure vital signs reviewed and stable Respiratory status: spontaneous breathing, respiratory function stable and patient connected to nasal cannula oxygen Cardiovascular status: blood pressure returned to baseline and stable Postop Assessment: no headache, no backache and epidural receding Anesthetic complications: no     Last Vitals:  Vitals:   07/06/16 0800 07/06/16 0825  BP: (!) 125/93 (!) 142/82  Pulse: 86 87  Resp: 15 16  Temp:  37 C    Last Pain:  Vitals:   07/06/16 0825  TempSrc: Oral  PainSc:    Pain Goal: Patients Stated Pain Goal: 0 (07/05/16 0600)               Shelton Silvas

## 2016-07-06 NOTE — Transfer of Care (Signed)
Immediate Anesthesia Transfer of Care Note  Patient: Kimberly Day  Procedure(s) Performed: Procedure(s): CESAREAN SECTION (N/A)  Patient Location: PACU  Anesthesia Type:Epidural  Level of Consciousness: awake  Airway & Oxygen Therapy: Patient Spontanous Breathing  Post-op Assessment: Report given to RN  Post vital signs: Reviewed and stable  Last Vitals:  Vitals:   07/06/16 0431 07/06/16 0501  BP: 115/62 117/68  Pulse: 86 92  Resp:    Temp:      Last Pain:  Vitals:   07/05/16 2331  TempSrc:   PainSc: Asleep      Patients Stated Pain Goal: 0 (07/05/16 0600)  Complications: No apparent anesthesia complications

## 2016-07-07 LAB — CBC
HEMATOCRIT: 25.7 % — AB (ref 36.0–46.0)
Hemoglobin: 8.3 g/dL — ABNORMAL LOW (ref 12.0–15.0)
MCH: 23.9 pg — AB (ref 26.0–34.0)
MCHC: 32.3 g/dL (ref 30.0–36.0)
MCV: 74.1 fL — ABNORMAL LOW (ref 78.0–100.0)
Platelets: 227 10*3/uL (ref 150–400)
RBC: 3.47 MIL/uL — ABNORMAL LOW (ref 3.87–5.11)
RDW: 14.7 % (ref 11.5–15.5)
WBC: 11.6 10*3/uL — ABNORMAL HIGH (ref 4.0–10.5)

## 2016-07-07 MED ORDER — FERROUS SULFATE 325 (65 FE) MG PO TABS
325.0000 mg | ORAL_TABLET | Freq: Every day | ORAL | Status: DC
  Administered 2016-07-07 – 2016-07-08 (×2): 325 mg via ORAL
  Filled 2016-07-07 (×2): qty 1

## 2016-07-07 MED ORDER — IBUPROFEN 600 MG PO TABS: 600.0000 mg | ORAL_TABLET | Freq: Four times a day (QID) | ORAL | 0 refills | Status: DC

## 2016-07-07 MED ORDER — PRENATAL 27-0.8 MG PO TABS: 1.0000 | ORAL_TABLET | Freq: Every day | ORAL | 11 refills | Status: AC

## 2016-07-07 NOTE — Progress Notes (Signed)
UR chart review completed.  

## 2016-07-07 NOTE — Anesthesia Postprocedure Evaluation (Signed)
Anesthesia Post Note  Patient: [place] Gage  Procedure(s) Performed: Procedure(s) (LRB): CESAREAN SECTION (N/A)  Patient location during evaluation: Mother Baby Anesthesia Type: Epidural Level of consciousness: awake, awake and alert, oriented and patient cooperative Pain management: pain level controlled Vital Signs Assessment: post-procedure vital signs reviewed and stable Respiratory status: spontaneous breathing, nonlabored ventilation and respiratory function stable Cardiovascular status: stable Postop Assessment: no headache, patient able to bend at knees, no backache and no signs of nausea or vomiting Anesthetic complications: no     Last Vitals:  Vitals:   07/07/16 0700 07/07/16 0815  BP: 125/85 102/64  Pulse: (!) 58 78  Resp: 18 18  Temp: 36.5 C 36.6 C    Last Pain:  Vitals:   07/07/16 0815  TempSrc: Oral  PainSc:    Pain Goal: Patients Stated Pain Goal: 0 (07/06/16 2334)               Embry Manrique L

## 2016-07-07 NOTE — Addendum Note (Signed)
Addendum  created 07/07/16 1327 by Yolonda Kida, CRNA   Sign clinical note

## 2016-07-07 NOTE — Progress Notes (Addendum)
[place], [place] DOB: 12/26/1980  Subjective: Postpartum Day 1: Cesarean Delivery Patient reports no complaints. Tolerating regular diet.  Ambulating and voiding without difficulty.  Pain well controlled. Scant lochia.     Objective: Vital signs in last 24 hours: Temp:  [97.7 F (36.5 C)-98.4 F (36.9 C)] 97.8 F (36.6 C) (07/24 0815) Pulse Rate:  [58-78] 78 (07/24 0815) Resp:  [18-20] 18 (07/24 0815) BP: (102-125)/(64-85) 102/64 (07/24 0815) SpO2:  [100 %] 100 % (07/23 2030)  Physical Exam:  General: alert and cooperative Lochia: appropriate Uterine Fundus: firm Incision: Small dried serosangenous drainage at center of incision.   DVT Evaluation: No evidence of DVT seen on physical exam. Calf/Ankle edema is present.   Recent Labs  07/05/16 0620 07/07/16 0513  HGB 10.3* 8.3*  HCT 31.9* 25.7*    Assessment/Plan: Status post Cesarean section. Doing well postoperatively.  Continue current care. May desire early discharge tomorrow. Unsure about birth control, all options discussed.  Out of bed and ambulation encouraged.   Asymptomatic anemia, iron tabs started. Rubella Immune, no need for MMR vaccine.       [facility] 07/07/2016, 2:24 PM

## 2016-07-08 MED ORDER — FERROUS SULFATE 325 (65 FE) MG PO TABS: 325.0000 mg | ORAL_TABLET | Freq: Every day | ORAL | 3 refills | Status: DC

## 2016-07-08 MED ORDER — MEASLES, MUMPS & RUBELLA VAC ~~LOC~~ INJ
0.5000 mL | INJECTION | Freq: Once | SUBCUTANEOUS | Status: DC
  Filled 2016-07-08: qty 0.5

## 2016-07-08 NOTE — Discharge Summary (Signed)
Obstetric Discharge Summary Reason for Admission: onset of labor Prenatal Procedures: NST and ultrasound Intrapartum Procedures: cesarean: low cervical, transverse Postpartum Procedures: none Complications-Operative and Postpartum: none Hemoglobin  Date Value Ref Range Status  07/07/2016 8.3 (L) 12.0 - 15.0 g/dL Final   HCT  Date Value Ref Range Status  07/07/2016 25.7 (L) 36.0 - 46.0 % Final    Physical Exam:  General: alert and cooperative Lochia: appropriate Uterine Fundus: firm Incision: healing well, no significant drainage, no dehiscence DVT Evaluation: No evidence of DVT seen on physical exam.  Discharge Diagnoses: Term Pregnancy-delivered.  Pt presented with labor and then had CS by Dr Charlotta Newton  For FTD.    Discharge Information: Date: 07/08/2016 Activity: pelvic rest Diet: routine Medications: PNV, Ibuprofen and Iron Condition: stable Instructions: refer to practice specific booklet Discharge to: home   Newborn Data: Live born female  Birth Weight: 6 lb 5.8 oz (2885 g) APGAR: 10, 10  Home with mother.  Kimberly Day A 07/08/2016, 1:39 PM

## 2016-07-08 NOTE — Progress Notes (Signed)
Patient is formula feeding and has not put the baby to breast for the last feedings. She reports having sore nipples and latch was painful. Offered to assist mother with positioning and latching, discussed management of lactation, supply and demand and nipple preference for newborn when supplementing. Presently mother states she does not want to latch her baby and is leaning towards exclusively formula feeding. Patient discussed pumping. Patient is active in WIC. Encouraged to call for assistance.  

## 2016-07-08 NOTE — Discharge Instructions (Signed)
Cesarean Delivery Cesarean delivery is the birth of a baby through a cut (incision) in the abdomen and womb (uterus).  LET YOUR HEALTH CARE PROVIDER KNOW ABOUT:  All medicines you are taking, including vitamins, herbs, eye drops, creams, and over-the-counter medicines.  Previous problems you or members of your family have had with the use of anesthetics.  Any bleeding or blood clotting disorders you have.  Family history of blood clots or bleeding disorders.  Any history of deep vein thrombosis (DVT) or pulmonary embolism (PE).  Previous surgeries you have had.  Medical conditions you have.  Any allergies you have.  Complicationsinvolving the pregnancy. RISKS AND COMPLICATIONS  Generally, this is a safe procedure. However, as with any procedure, complications can occur. Possible complications include:  Bleeding.  Infection.  Blood clots.  Injury to surrounding organs.  Problems with anesthesia.  Injury to the baby. BEFORE THE PROCEDURE   You may be given an antacid medicine to drink. This will prevent acid contents in your stomach from going into your lungs if you vomit during the surgery.  You may be given an antibiotic medicine to prevent infection. PROCEDURE   To prevent infection of your incision:  Hair may be removed from your pubic area if it is near your incision.  The skin of your pubic area and lower abdomen will be cleaned with a germ-killing solution (antiseptic).  A tube (Foley catheter) will be placed in your bladder to drain your urine from your bladder into a bag. This keeps your bladder empty during surgery.  An IV tube will be placed in your vein.  You may be given medicine to numb the lower half of your body (regional anesthetic). If you were in labor, you may have already had an epidural in place which can be used in both labor and cesarean delivery. You may possibly be given medicine to make you sleep (general anesthetic) though this is not as  common.  Your heart rate and your baby's heart rate will be monitored.  An incision will be made in your abdomen that extends to your uterus. There are 2 basic kinds of incisions:  The horizontal (transverse) incision. Horizontal incisions are from side to side and are used for most routine cesarean deliveries.  The vertical incision. The vertical incision is from the top of the abdomen to the bottom and is less commonly used. It is often done for women who have a serious complication (extreme prematurity) or under emergency situations.  The horizontal and vertical incisions may both be used at the same time. However, this is very uncommon.  An incision is then made in your uterus to deliver the baby.  Your baby will be delivered.  Your health care provider may place the baby on your chest. It is important to keep the baby warm. Your health care provider will dry off the baby, place the baby directly on your bare skin, and cover the baby with warm, dry blankets.  Both incisions will be closed with absorbable stitches. AFTER THE PROCEDURE   If you were awake during the surgery, you will see your baby right away. If you were asleep, you will see your baby as soon as you are awake.  You may breastfeed your baby after surgery.  You may be able to get up and walk the same day as the surgery. If you need to stay in bed for a period of time, you will receive help to turn, cough, and take deep breaths after   surgery. This helps prevent lung problems such as pneumonia.  Do not get out of bed alone the first time after surgery. You will need help getting out of bed until you are able to do this by yourself.  You may be able to shower the day after your cesarean delivery. After the bandage (dressing) is taken off the incision site, a nurse will assist you to shower if you would like help.  You may be directed to take actions to help prevent blood clots in your legs. These may  include:  Walking shortly after surgery, with someone assisting you. Moving around after surgery helps to improve blood flow.  Wearing compression stockings or using different types of devices.  Taking medicines to thin your blood (anticoagulants) if you are at high risk for DVT or PE.  Save any blood clots that you pass from your vagina. If you pass a clot while on the toilet, do not flush it. Call for the nurse. Tell the nurse if you think you are bleeding too much or passing too many clots.  You will be given medicine for pain and nausea as needed. Let your health care providers know if you are hurting. You may also be given an antibiotic to prevent an infection.  Your IV tube will be taken out when you are drinking a reasonable amount of fluids. The Foley catheter is taken out when you are up and walking.  If your blood type is Rh negative and your baby's blood type is Rh positive, you will be given a shot of anti-D immune globulin. This shot prevents you from having Rh problems with a future pregnancy. You should get the shot even if you had your tubes tied (tubal ligation).  If you are allowed to take the baby for a walk, place the baby in the bassinet and push it.   This information is not intended to replace advice given to you by your health care provider. Make sure you discuss any questions you have with your health care provider.   Document Released: 12/01/2005 Document Revised: 08/22/2015 Document Reviewed: 07/28/2012 Elsevier Interactive Patient Education 2016 [employer]  

## 2019-12-16 NOTE — L&D Delivery Note (Addendum)
Delivery Note   Patient Name: Kimberly Day DOB: 09-29-1991 MRN: [id number]  Date of admission: 09/02/2020 Delivering MD: Dale Metcalfe  Date of delivery: 09/03/20 Type of delivery: SVD  Newborn Data: Live born female  Birth Weight:   APGAR: 9, 9   Newborn Delivery   Birth date/time: 09/03/2020 08:16:00 Delivery type:      Gevena Cotton, 29 y.o., @ [redacted]w[redacted]d,  G2P1001, who was admitted for IOL for postdates, TOLAC. I was called to the room when she progressed 2+ station in the second stage of labor.  She pushed for 2hours.  She delivered a viable infant, cephalic and restituted to the ROT position over an intact perineum.  A nuchal cord   was identified, loose unable to reduce newborn somersaulted through for successful VBAC. The baby was placed on maternal abdomen while initial step of NRP were perfmored (Dry, Stimulated, and warmed). Hat placed on baby for thermoregulation. Delayed cord clamping was performed for 2 minutes.  Cord double clamped and cut.  Cord cut by GM. Apgar scores were 9 and 9. Prophylactic Pitocin was started in the third stage of labor for active management. The placenta delivered spontaneously, shultz, with a 3 vessel cord and was sent to pathology for suspected chorio.  Inspection revealed none. An examination of the vaginal vault and cervix was free from lacerations. The uterus was firm, bleeding stable.   Umbilical artery blood gas were not sent.  There were no complications during the procedure.  Mom and baby skin to skin following delivery. Left in stable condition. Maternal temp currently 100.8, tylenol given and second dose of Unasyn hung, plan to continue for 24 hours. Foley had been removed 2.5 hours prior to delivery, red robin inserted and yielded of urine output.   Maternal Info: Anesthesia: Epidural Episiotomy: No Lacerations:  NO Suture Repair: No Est. Blood Loss (mL):   Newborn Info:  Baby Sex: female Circumcision: in pt  desired Babies Name: [name] APGAR (1 MIN):  9 APGAR (5 MINS): 9  APGAR (10 MINS):     Mom to postpartum.  Baby to Couplet care / Skin to Skin.  DR Sallye Ober aware.    [place], [place], NP-C 09/03/20 8:39 AM

## 2020-01-16 LAB — OB RESULTS CONSOLE GC/CHLAMYDIA
Chlamydia: NEGATIVE
Gonorrhea: NEGATIVE

## 2020-02-03 LAB — OB RESULTS CONSOLE RUBELLA ANTIBODY, IGM: Rubella: IMMUNE

## 2020-02-03 LAB — OB RESULTS CONSOLE HEPATITIS B SURFACE ANTIGEN: Hepatitis B Surface Ag: NEGATIVE

## 2020-02-03 LAB — OB RESULTS CONSOLE HIV ANTIBODY (ROUTINE TESTING): HIV: NONREACTIVE

## 2020-02-28 ENCOUNTER — Other Ambulatory Visit (HOSPITAL_COMMUNITY): Payer: Self-pay | Admitting: Obstetrics and Gynecology

## 2020-02-28 DIAGNOSIS — Z3689 Encounter for other specified antenatal screening: Secondary | ICD-10-CM

## 2020-02-28 DIAGNOSIS — Z3A19 19 weeks gestation of pregnancy: Secondary | ICD-10-CM

## 2020-03-06 ENCOUNTER — Other Ambulatory Visit (HOSPITAL_COMMUNITY): Payer: Self-pay | Admitting: Obstetrics and Gynecology

## 2020-04-02 ENCOUNTER — Encounter (HOSPITAL_COMMUNITY): Payer: Self-pay | Admitting: *Deleted

## 2020-04-03 ENCOUNTER — Other Ambulatory Visit (HOSPITAL_COMMUNITY): Payer: Self-pay | Admitting: Obstetrics and Gynecology

## 2020-04-03 ENCOUNTER — Other Ambulatory Visit: Payer: Self-pay

## 2020-04-03 ENCOUNTER — Other Ambulatory Visit (HOSPITAL_COMMUNITY): Payer: Self-pay | Admitting: *Deleted

## 2020-04-03 ENCOUNTER — Ambulatory Visit (HOSPITAL_COMMUNITY)
Admission: RE | Admit: 2020-04-03 | Discharge: 2020-04-03 | Disposition: A | Discharge status: Home / Self Care | Payer: BC Managed Care – PPO | Source: Ambulatory Visit | Attending: Obstetrics and Gynecology | Admitting: Obstetrics and Gynecology

## 2020-04-03 DIAGNOSIS — O34219 Maternal care for unspecified type scar from previous cesarean delivery: Secondary | ICD-10-CM

## 2020-04-03 DIAGNOSIS — Z3689 Encounter for other specified antenatal screening: Secondary | ICD-10-CM

## 2020-04-03 DIAGNOSIS — Z3A19 19 weeks gestation of pregnancy: Secondary | ICD-10-CM

## 2020-04-03 DIAGNOSIS — Z362 Encounter for other antenatal screening follow-up: Secondary | ICD-10-CM

## 2020-04-03 DIAGNOSIS — D649 Anemia, unspecified: Secondary | ICD-10-CM

## 2020-04-03 DIAGNOSIS — E669 Obesity, unspecified: Secondary | ICD-10-CM

## 2020-04-03 DIAGNOSIS — O99012 Anemia complicating pregnancy, second trimester: Secondary | ICD-10-CM

## 2020-04-03 DIAGNOSIS — O99212 Obesity complicating pregnancy, second trimester: Secondary | ICD-10-CM

## 2020-04-09 ENCOUNTER — Encounter (HOSPITAL_COMMUNITY): Payer: Self-pay | Admitting: Obstetrics & Gynecology

## 2020-05-01 ENCOUNTER — Other Ambulatory Visit: Payer: Self-pay

## 2020-05-01 ENCOUNTER — Ambulatory Visit: Payer: BC Managed Care – PPO | Admitting: *Deleted

## 2020-05-01 ENCOUNTER — Ambulatory Visit (HOSPITAL_COMMUNITY): Payer: BC Managed Care – PPO | Attending: Obstetrics and Gynecology

## 2020-05-01 ENCOUNTER — Encounter: Payer: Self-pay | Admitting: *Deleted

## 2020-05-01 VITALS — BP 136/75 | HR 83

## 2020-05-01 DIAGNOSIS — Z362 Encounter for other antenatal screening follow-up: Secondary | ICD-10-CM

## 2020-05-01 DIAGNOSIS — Z3A23 23 weeks gestation of pregnancy: Secondary | ICD-10-CM

## 2020-05-01 DIAGNOSIS — E669 Obesity, unspecified: Secondary | ICD-10-CM

## 2020-05-01 DIAGNOSIS — O99012 Anemia complicating pregnancy, second trimester: Secondary | ICD-10-CM | POA: Diagnosis not present

## 2020-05-01 DIAGNOSIS — Z3689 Encounter for other specified antenatal screening: Secondary | ICD-10-CM

## 2020-05-01 DIAGNOSIS — O34219 Maternal care for unspecified type scar from previous cesarean delivery: Secondary | ICD-10-CM | POA: Diagnosis not present

## 2020-05-01 DIAGNOSIS — O359XX0 Maternal care for (suspected) fetal abnormality and damage, unspecified, not applicable or unspecified: Secondary | ICD-10-CM | POA: Diagnosis not present

## 2020-05-01 DIAGNOSIS — D649 Anemia, unspecified: Secondary | ICD-10-CM

## 2020-05-01 DIAGNOSIS — O99212 Obesity complicating pregnancy, second trimester: Secondary | ICD-10-CM

## 2020-07-31 LAB — OB RESULTS CONSOLE GBS: GBS: POSITIVE

## 2020-08-27 ENCOUNTER — Other Ambulatory Visit: Payer: Self-pay | Admitting: Obstetrics and Gynecology

## 2020-08-28 ENCOUNTER — Telehealth (HOSPITAL_COMMUNITY): Payer: Self-pay | Admitting: *Deleted

## 2020-08-28 ENCOUNTER — Encounter (HOSPITAL_COMMUNITY): Payer: Self-pay | Admitting: *Deleted

## 2020-08-28 NOTE — Telephone Encounter (Signed)
Preadmission screen  

## 2020-08-31 ENCOUNTER — Other Ambulatory Visit (HOSPITAL_COMMUNITY)
Admission: RE | Admit: 2020-08-31 | Discharge: 2020-08-31 | Disposition: A | Discharge status: Home / Self Care | Payer: BC Managed Care – PPO | Source: Ambulatory Visit | Attending: Obstetrics and Gynecology | Admitting: Obstetrics and Gynecology

## 2020-08-31 DIAGNOSIS — Z01812 Encounter for preprocedural laboratory examination: Secondary | ICD-10-CM | POA: Insufficient documentation

## 2020-08-31 DIAGNOSIS — Z20822 Contact with and (suspected) exposure to covid-19: Secondary | ICD-10-CM | POA: Insufficient documentation

## 2020-08-31 LAB — SARS CORONAVIRUS 2 (TAT 6-24 HRS): SARS Coronavirus 2: NEGATIVE

## 2020-09-02 ENCOUNTER — Other Ambulatory Visit: Payer: Self-pay

## 2020-09-02 ENCOUNTER — Inpatient Hospital Stay (HOSPITAL_COMMUNITY)
Admission: AD | Admit: 2020-09-02 | Discharge: 2020-09-04 | DRG: 805 | Disposition: A | Discharge status: Home / Self Care | Payer: BC Managed Care – PPO | Attending: Obstetrics and Gynecology | Admitting: Obstetrics and Gynecology

## 2020-09-02 ENCOUNTER — Encounter (HOSPITAL_COMMUNITY): Payer: Self-pay | Admitting: Obstetrics and Gynecology

## 2020-09-02 ENCOUNTER — Inpatient Hospital Stay (HOSPITAL_COMMUNITY): Payer: BC Managed Care – PPO | Admitting: Anesthesiology

## 2020-09-02 ENCOUNTER — Inpatient Hospital Stay (HOSPITAL_COMMUNITY): Payer: BC Managed Care – PPO

## 2020-09-02 DIAGNOSIS — O134 Gestational [pregnancy-induced] hypertension without significant proteinuria, complicating childbirth: Secondary | ICD-10-CM | POA: Diagnosis present

## 2020-09-02 DIAGNOSIS — O365930 Maternal care for other known or suspected poor fetal growth, third trimester, not applicable or unspecified: Secondary | ICD-10-CM | POA: Diagnosis present

## 2020-09-02 DIAGNOSIS — Z87891 Personal history of nicotine dependence: Secondary | ICD-10-CM

## 2020-09-02 DIAGNOSIS — O139 Gestational [pregnancy-induced] hypertension without significant proteinuria, unspecified trimester: Secondary | ICD-10-CM | POA: Diagnosis not present

## 2020-09-02 DIAGNOSIS — Z3A41 41 weeks gestation of pregnancy: Secondary | ICD-10-CM

## 2020-09-02 DIAGNOSIS — O99824 Streptococcus B carrier state complicating childbirth: Secondary | ICD-10-CM | POA: Diagnosis present

## 2020-09-02 DIAGNOSIS — Z20822 Contact with and (suspected) exposure to covid-19: Secondary | ICD-10-CM | POA: Diagnosis present

## 2020-09-02 DIAGNOSIS — O480 Post-term pregnancy: Secondary | ICD-10-CM | POA: Diagnosis present

## 2020-09-02 DIAGNOSIS — O99214 Obesity complicating childbirth: Secondary | ICD-10-CM | POA: Diagnosis present

## 2020-09-02 DIAGNOSIS — O411230 Chorioamnionitis, third trimester, not applicable or unspecified: Secondary | ICD-10-CM | POA: Diagnosis present

## 2020-09-02 DIAGNOSIS — O34219 Maternal care for unspecified type scar from previous cesarean delivery: Secondary | ICD-10-CM | POA: Diagnosis present

## 2020-09-02 DIAGNOSIS — O9902 Anemia complicating childbirth: Secondary | ICD-10-CM | POA: Diagnosis present

## 2020-09-02 DIAGNOSIS — Z3490 Encounter for supervision of normal pregnancy, unspecified, unspecified trimester: Secondary | ICD-10-CM | POA: Diagnosis present

## 2020-09-02 DIAGNOSIS — O411290 Chorioamnionitis, unspecified trimester, not applicable or unspecified: Secondary | ICD-10-CM | POA: Diagnosis not present

## 2020-09-02 LAB — COMPREHENSIVE METABOLIC PANEL
ALT: 39 U/L (ref 0–44)
AST: 36 U/L (ref 15–41)
Albumin: 2.4 g/dL — ABNORMAL LOW (ref 3.5–5.0)
Alkaline Phosphatase: 164 U/L — ABNORMAL HIGH (ref 38–126)
Anion gap: 13 (ref 5–15)
BUN: <5 mg/dL — ABNORMAL LOW (ref 6–20)
CO2: 20 mmol/L — ABNORMAL LOW (ref 22–32)
Calcium: 8.4 mg/dL — ABNORMAL LOW (ref 8.9–10.3)
Chloride: 104 mmol/L (ref 98–111)
Creatinine, Ser: 0.75 mg/dL (ref 0.44–1.00)
GFR calc Af Amer: >60 mL/min (ref >60)
GFR calc non Af Amer: >60 mL/min (ref >60)
Glucose, Bld: 74 mg/dL (ref 70–99)
Potassium: 3.4 mmol/L — ABNORMAL LOW (ref 3.5–5.1)
Sodium: 137 mmol/L (ref 135–145)
Total Bilirubin: 0.6 mg/dL (ref 0.3–1.2)
Total Protein: 6.2 g/dL — ABNORMAL LOW (ref 6.5–8.1)

## 2020-09-02 LAB — TYPE AND SCREEN
ABO/RH(D): O POS
Antibody Screen: NEGATIVE

## 2020-09-02 LAB — CBC
HCT: 31.5 % — ABNORMAL LOW (ref 36.0–46.0)
Hemoglobin: 9.9 g/dL — ABNORMAL LOW (ref 12.0–15.0)
MCH: 24.5 pg — ABNORMAL LOW (ref 26.0–34.0)
MCHC: 31.4 g/dL (ref 30.0–36.0)
MCV: 78 fL — ABNORMAL LOW (ref 80.0–100.0)
Platelets: 254 10*3/uL (ref 150–400)
RBC: 4.04 MIL/uL (ref 3.87–5.11)
RDW: 13.8 % (ref 11.5–15.5)
WBC: 6.8 10*3/uL (ref 4.0–10.5)
nRBC: 0 % (ref 0.0–0.2)

## 2020-09-02 LAB — PROTEIN / CREATININE RATIO, URINE
Creatinine, Urine: 74.34 mg/dL
Protein Creatinine Ratio: 0.15 mg/mg{Cre} (ref 0.00–0.15)
Total Protein, Urine: 11 mg/dL

## 2020-09-02 LAB — RPR: RPR Ser Ql: NONREACTIVE

## 2020-09-02 MED ORDER — OXYTOCIN-SODIUM CHLORIDE 30-0.9 UT/500ML-% IV SOLN
2.5000 [IU]/h | INTRAVENOUS | Status: DC
  Filled 2020-09-02 (×2): qty 500

## 2020-09-02 MED ORDER — PHENYLEPHRINE 40 MCG/ML (10ML) SYRINGE FOR IV PUSH (FOR BLOOD PRESSURE SUPPORT)
80.0000 ug | PREFILLED_SYRINGE | INTRAVENOUS | Status: DC | PRN
  Filled 2020-09-02: qty 10

## 2020-09-02 MED ORDER — OXYTOCIN BOLUS FROM INFUSION
333.0000 mL | Freq: Once | INTRAVENOUS | Status: AC
  Administered 2020-09-03: 333 mL via INTRAVENOUS

## 2020-09-02 MED ORDER — EPHEDRINE 5 MG/ML INJ: 10.0000 mg | INTRAVENOUS | Status: DC | PRN

## 2020-09-02 MED ORDER — OXYCODONE-ACETAMINOPHEN 5-325 MG PO TABS: 1.0000 | ORAL_TABLET | ORAL | Status: DC | PRN

## 2020-09-02 MED ORDER — TERBUTALINE SULFATE 1 MG/ML IJ SOLN: 0.2500 mg | Freq: Once | INTRAMUSCULAR | Status: DC | PRN

## 2020-09-02 MED ORDER — ONDANSETRON HCL 4 MG/2ML IJ SOLN
4.0000 mg | Freq: Four times a day (QID) | INTRAMUSCULAR | Status: DC | PRN
  Administered 2020-09-02: 4 mg via INTRAVENOUS
  Filled 2020-09-02: qty 2

## 2020-09-02 MED ORDER — SODIUM CHLORIDE 0.9 % IV SOLN
5.0000 10*6.[IU] | Freq: Once | INTRAVENOUS | Status: AC
  Administered 2020-09-02: 5 10*6.[IU] via INTRAVENOUS
  Filled 2020-09-02: qty 5

## 2020-09-02 MED ORDER — LACTATED RINGERS IV SOLN
500.0000 mL | Freq: Once | INTRAVENOUS | Status: AC
  Administered 2020-09-02: 500 mL via INTRAVENOUS

## 2020-09-02 MED ORDER — SOD CITRATE-CITRIC ACID 500-334 MG/5ML PO SOLN: 30.0000 mL | ORAL | Status: DC | PRN

## 2020-09-02 MED ORDER — ACETAMINOPHEN 325 MG PO TABS
650.0000 mg | ORAL_TABLET | ORAL | Status: DC | PRN
  Administered 2020-09-03: 650 mg via ORAL
  Filled 2020-09-02 (×2): qty 2

## 2020-09-02 MED ORDER — LACTATED RINGERS IV SOLN: INTRAVENOUS | Status: DC

## 2020-09-02 MED ORDER — DIPHENHYDRAMINE HCL 50 MG/ML IJ SOLN: 12.5000 mg | INTRAMUSCULAR | Status: DC | PRN

## 2020-09-02 MED ORDER — PENICILLIN G POT IN DEXTROSE 60000 UNIT/ML IV SOLN
3.0000 10*6.[IU] | INTRAVENOUS | Status: DC
  Administered 2020-09-02 – 2020-09-03 (×7): 3 10*6.[IU] via INTRAVENOUS
  Filled 2020-09-02 (×8): qty 50

## 2020-09-02 MED ORDER — PHENYLEPHRINE 40 MCG/ML (10ML) SYRINGE FOR IV PUSH (FOR BLOOD PRESSURE SUPPORT): 80.0000 ug | PREFILLED_SYRINGE | INTRAVENOUS | Status: DC | PRN

## 2020-09-02 MED ORDER — FENTANYL CITRATE (PF) 100 MCG/2ML IJ SOLN
100.0000 ug | INTRAMUSCULAR | Status: DC | PRN
  Administered 2020-09-02 (×2): 100 ug via INTRAVENOUS
  Filled 2020-09-02: qty 2

## 2020-09-02 MED ORDER — OXYCODONE-ACETAMINOPHEN 5-325 MG PO TABS: 2.0000 | ORAL_TABLET | ORAL | Status: DC | PRN

## 2020-09-02 MED ORDER — FENTANYL CITRATE (PF) 100 MCG/2ML IJ SOLN
INTRAMUSCULAR | Status: AC
Start: 2020-09-02 — End: 2020-09-02
  Filled 2020-09-02: qty 2

## 2020-09-02 MED ORDER — LIDOCAINE HCL (PF) 1 % IJ SOLN: 30.0000 mL | INTRAMUSCULAR | Status: DC | PRN

## 2020-09-02 MED ORDER — MISOPROSTOL 25 MCG QUARTER TABLET: 25.0000 ug | ORAL_TABLET | ORAL | Status: DC | PRN

## 2020-09-02 MED ORDER — LACTATED RINGERS IV SOLN
500.0000 mL | INTRAVENOUS | Status: DC | PRN
  Administered 2020-09-03: 500 mL via INTRAVENOUS

## 2020-09-02 MED ORDER — SODIUM CHLORIDE (PF) 0.9 % IJ SOLN
INTRAMUSCULAR | Status: DC | PRN
Start: 2020-09-02 — End: 2020-09-03
  Administered 2020-09-02: 12 mL/h via EPIDURAL

## 2020-09-02 MED ORDER — LIDOCAINE HCL (PF) 1 % IJ SOLN
INTRAMUSCULAR | Status: DC | PRN
  Administered 2020-09-02 (×2): 4 mL via EPIDURAL

## 2020-09-02 MED ORDER — OXYTOCIN-SODIUM CHLORIDE 30-0.9 UT/500ML-% IV SOLN
1.0000 m[IU]/min | INTRAVENOUS | Status: DC
  Administered 2020-09-02: 1 m[IU]/min via INTRAVENOUS

## 2020-09-02 MED ORDER — FENTANYL-BUPIVACAINE-NACL 0.5-0.125-0.9 MG/250ML-% EP SOLN
12.0000 mL/h | EPIDURAL | Status: DC | PRN
  Filled 2020-09-02: qty 250

## 2020-09-02 NOTE — Progress Notes (Signed)
Subjective:    Pt request to discuss chances of successful VBAC. Questions, risks, and benefits reviewed of TOLAC and elective repeat C/S. Pt elects to continue w/ trial of labor and agrees to cervical balloon. IV sedation provided for comfort.   Objective:    VS: BP (!) 151/76   Pulse 97   Temp 98.2 F (36.8 C) (Oral)   Resp 16   Ht 5\' 3"  (1.6 m)   Wt 104.8 kg   LMP 11/20/2019   BMI 40.93 kg/m  FHR : baseline 125 / variability moderate / accelerations present / absent decelerations Toco: contractions every 5-6 minutes  Membranes: intact Dilation: 1.5 Effacement (%): Thick Station: -2 Presentation: Vertex Exam by:: Karren Newland,cnm Pitocin 10 mU/min, Pitocin to be reduce to 6 mU/min while cervicall balloon is in place.   Assessment/Plan:   29 y.o. G2P1001 [redacted]w[redacted]d IOL for dates Primary C/S for arrest of dilation @ 4 cm and chorio Desires TOLAC  Labor: S/P Pitocin for cervical ripening. Cervical balloon w/ 74ml uterine and 56ml vaginal inserted w/o diff and pt tolerated well.   Preeclampsia:  BP elevated. CMP and urine protein creatinine ration ordered Fetal Wellbeing:  Category I Pain Control:  planning epidural I/D:  GBS pos, adequate treatment w/ PCN Anticipated MOD:  NSVD  72m MSN, CNM 09/02/2020 10:12 AM

## 2020-09-02 NOTE — Anesthesia Preprocedure Evaluation (Signed)
Anesthesia Evaluation  Patient identified by MRN, date of birth, ID band Patient awake    Reviewed: Allergy & Precautions, Patient's Chart, lab work & pertinent test results  Airway Mallampati: II  TM Distance: >3 FB Neck ROM: Full    Dental no notable dental hx. (+) Teeth Intact   Pulmonary former smoker,    Pulmonary exam normal breath sounds clear to auscultation       Cardiovascular negative cardio ROS Normal cardiovascular exam Rhythm:Regular Rate:Normal     Neuro/Psych negative neurological ROS  negative psych ROS   GI/Hepatic Neg liver ROS, GERD  Medicated and Controlled,  Endo/Other  Morbid obesity  Renal/GU negative Renal ROS  negative genitourinary   Musculoskeletal negative musculoskeletal ROS (+)   Abdominal (+) + obese,   Peds  Hematology  (+) anemia ,   Anesthesia Other Findings   Reproductive/Obstetrics (+) Pregnancy                             Anesthesia Physical Anesthesia Plan  ASA: III  Anesthesia Plan: Epidural   Post-op Pain Management:    Induction:   PONV Risk Score and Plan:   Airway Management Planned: Natural Airway  Additional Equipment:   Intra-op Plan:   Post-operative Plan:   Informed Consent: I have reviewed the patients History and Physical, chart, labs and discussed the procedure including the risks, benefits and alternatives for the proposed anesthesia with the patient or authorized representative who has indicated his/her understanding and acceptance.       Plan Discussed with: Anesthesiologist  Anesthesia Plan Comments:         Anesthesia Quick Evaluation

## 2020-09-02 NOTE — Anesthesia Procedure Notes (Signed)
Epidural Patient location during procedure: OB Start time: 09/02/2020 4:17 PM End time: 09/02/2020 4:24 PM  Staffing Anesthesiologist: Mal Amabile, MD Performed: anesthesiologist   Preanesthetic Checklist Completed: patient identified, IV checked, site marked, risks and benefits discussed, surgical consent, monitors and equipment checked, pre-op evaluation and timeout performed  Epidural Patient position: sitting Prep: DuraPrep and site prepped and draped Patient monitoring: continuous pulse ox and blood pressure Approach: midline Location: L3-L4 Injection technique: LOR air  Needle:  Needle type: Tuohy  Needle gauge: 17 G Needle length: 9 cm and 9 Needle insertion depth: 6 cm Catheter type: closed end flexible Catheter size: 19 Gauge Catheter at skin depth: 11 cm Test dose: negative and Other  Assessment Events: blood not aspirated, injection not painful, no injection resistance, no paresthesia and negative IV test  Additional Notes Patient identified. Risks and benefits discussed including failed block, incomplete  Pain control, post dural puncture headache, nerve damage, paralysis, blood pressure Changes, nausea, vomiting, reactions to medications-both toxic and allergic and post Partum back pain. All questions were answered. Patient expressed understanding and wished to proceed. Sterile technique was used throughout procedure. Epidural site was Dressed with sterile barrier dressing. No paresthesias, signs of intravascular injection Or signs of intrathecal spread were encountered.  Patient was more comfortable after the epidural was dosed. Please see RN's note for documentation of vital signs and FHR which are stable. Reason for block:procedure for pain

## 2020-09-02 NOTE — Progress Notes (Signed)
Subjective:    Comfortable w/ epidural. Discussed amniotomy and pt agrees.   Objective:    VS: BP 120/62   Pulse 71   Temp 98.1 F (36.7 C) (Oral)   Resp 18   Ht 5\' 3"  (1.6 m)   Wt 104.8 kg   LMP 11/20/2019   SpO2 100%   BMI 40.93 kg/m  FHR : baseline 135 / variability moderate / accelerations present / absent decelerations Toco: contractions every 3-4 minutes  Membranes: AROM, clear, scant Dilation: 6 Effacement (%): 70 Cervical Position: Posterior Station: -2 Presentation: Vertex Exam by:: [email or web address] Pitocin 9 mU/min  Assessment/Plan:   29 y.o. G2P1001 [redacted]w[redacted]d IOL for dates Hx Primary C/S for arrest of dilation @ 4 cm and chorio Desires TOLAC  Labor: S/P Cervical balloon w/ low-dose Pitocin, amniotomy performed, will continue to monitor Preeclampsia:  no signs or symptoms of toxicity Fetal Wellbeing:  Category I Pain Control:  Epidural I/D:  GBS pos, adequate PCN prophylaxis Anticipated MOD:  NSVD  [redacted]w[redacted]d MSN, CNM 09/02/2020 5:56 PM

## 2020-09-02 NOTE — Progress Notes (Signed)
Labor Progress Note  Subjective: Kimberly Day, 29 y.o., G2P1001, with an IUP @ [redacted]w[redacted]d, presented for IOL for postdates, endorses feeling regular cxt.  Patient Active Problem List   Diagnosis Date Noted  . Encounter for induction of labor 09/02/2020  . Delayed delivery after SROM (spontaneous rupture of membranes) 07/05/2016   Objective: BP 128/70   Pulse 70   Temp 98.6 F (37 C) (Oral)   Resp 16   Ht 5\' 3"  (1.6 m)   Wt 104.8 kg   LMP 11/20/2019   BMI 40.93 kg/m  No intake/output data recorded. No intake/output data recorded. NST: FHR baseline 120 bpm, Variability: moderate, Accelerations:present, Decelerations:  Absent= Cat 1/Reactive CTX:  occ Uterus gravid, soft non tender, moderate to palpate with contractions.  SVE:  Dilation: 1 Effacement (%): 50 Station: -3 Exam by:: [email or web address] RN Pitocin at  6 mUn/min  Assessment:  Kimberly Day is a 29 y.o. female, G2P1001, IUP at 20 weeks, presenting for IOL for postdates with TOLAC. Pt endorse + Fm. Denies vaginal leakage. Denies vaginal bleeding. Denies feeling cxt's. Group B Streptococcus carrier will tx. History of cesarean section (2017, with [facility] for FTP (4 cm, baby 6+5), Dr. 10-04-2005 for [facility], desires VBAC, consent signed.). Polydactyly of fingers. Small for gestational age fetus (11th%ile at 20 weeks, f/u scheduled in 4 weeks.). Low risk female. Anemia (Hgb 10.9 at NOB, recommended Fe). Rubella immune, AA Neg HIV, Neg hep b, rpr NR, cg/g neg, hgb NOV 11.3, at 28 weeks 10.6. Started pnc at [facility] at 13.3 weeks, and last was 40.6 weeks. Normal GTT. Progressing with low dose pitocin Patient Active Problem List   Diagnosis Date Noted  . Encounter for induction of labor 09/02/2020  . Delayed delivery after SROM (spontaneous rupture of membranes) 07/05/2016   NICHD: Category 1  Membranes:  Intact, no s/s of infection  Induction:    Foley Bulb: PRN  Pitocin - 6  Pain management:               IV pain management: x PRN              Epidural placement:  PRN  GBS postivie  Abx: PCN @ 0117, and 0518   Plan: Continue labor plan Continuous/intermittent monitoring Rest Ambulate Frequent position changes to facilitate fetal rotation and descent. CNM 07/07/2016 will reassess with cervical exam at 0900 or earlier if necessary Anticipate foley bulb placement when able  Anticipate labor progression and vaginal delivery.   Maureen Ralphs CNM and Dr Maureen Ralphs to assume care of pt when report is given at 0700.   Normand Sloop, NP-C, CNM, MSN 09/02/2020. 6:23 AM

## 2020-09-02 NOTE — H&P (Signed)
Kimberly Day is a 29 y.o. female, G2P1001, IUP at 36 weeks, presenting for IOL for postdates with TOLAC. Pt endorse + Fm. Denies vaginal leakage. Denies vaginal bleeding. Denies feeling cxt's. Group B Streptococcus carrier will tx. History of cesarean section (2017, with [facility] for FTP (4 cm, baby 6+5), Dr. Charlotta Newton for [facility], desires VBAC, consent signed.). Polydactyly of fingers. Small for gestational age fetus (11th%ile at 20 weeks, f/u scheduled in 4 weeks.). Low risk female. Anemia (Hgb 10.9 at NOB, recommended Fe). Rubella immune, AA Neg HIV, Neg hep b, rpr NR, cg/g neg, hgb NOV 11.3, at 28 weeks 10.6. Started pnc at [facility]ov at 13.3 weeks, and last was 40.6 weeks. Normal GTT.   Patient Active Problem List   Diagnosis Date Noted  . Encounter for induction of labor 09/02/2020  . Delayed delivery after SROM (spontaneous rupture of membranes) 07/05/2016   Medications Prior to Admission  Medication Sig Dispense Refill Last Dose  . ferrous sulfate 325 (65 FE) MG tablet Take 1 tablet (325 mg total) by mouth daily with breakfast. 90 tablet 3   . ibuprofen (ADVIL,MOTRIN) 600 MG tablet Take 1 tablet (600 mg total) by mouth every 6 (six) hours. 30 tablet 0   . Prenatal Vit-Fe Fumarate-FA (MULTIVITAMIN-PRENATAL) 27-0.8 MG TABS tablet Take 1 tablet by mouth daily at 12 noon. 30 each 11   . ranitidine (ZANTAC) 150 MG tablet Take 150 mg by mouth 2 (two) times daily.       Past Medical History:  Diagnosis Date  . Medical history non-contributory      No current facility-administered medications on file prior to encounter.   Current Outpatient Medications on File Prior to Encounter  Medication Sig Dispense Refill  . ferrous sulfate 325 (65 FE) MG tablet Take 1 tablet (325 mg total) by mouth daily with breakfast. 90 tablet 3  . ibuprofen (ADVIL,MOTRIN) 600 MG tablet Take 1 tablet (600 mg total) by mouth every 6 (six) hours. 30 tablet 0  . Prenatal Vit-Fe Fumarate-FA (MULTIVITAMIN-PRENATAL) 27-0.8 MG TABS  tablet Take 1 tablet by mouth daily at 12 noon. 30 each 11  . ranitidine (ZANTAC) 150 MG tablet Take 150 mg by mouth 2 (two) times daily.       No Known Allergies    OB History    Gravida  2   Para  1   Term  1   Preterm      AB      Living  1     SAB      TAB      Ectopic      Multiple  0   Live Births  1          Past Medical History:  Diagnosis Date  . Medical history non-contributory    Past Surgical History:  Procedure Laterality Date  . CESAREAN SECTION N/A 07/06/2016   Procedure: CESAREAN SECTION;  Surgeon: Myna Hidalgo, DO;  Location: [facility];  Service: Obstetrics;  Laterality: N/A;  . ELBOW SURGERY Right    Family History: family history includes Breast cancer in her mother; Hypertension in her father. Social History:  reports that she has quit smoking. Her smoking use included cigarettes. She smoked 0.25 packs per day. She has never used smokeless tobacco. She reports that she does not drink alcohol and does not use drugs.   Prenatal Transfer Tool  Maternal Diabetes: No Genetic Screening: Normal Maternal Ultrasounds/Referrals: Normal Fetal Ultrasounds or other Referrals:  None Maternal Substance Abuse:  No Significant Maternal Medications:  None Significant Maternal Lab Results: Group B Strep positive  ROS:  Review of Systems  Constitutional: Negative.   HENT: Negative.   Eyes: Negative.   Respiratory: Negative.   Cardiovascular: Negative.   Gastrointestinal: Positive for abdominal pain.  Genitourinary: Negative.   Musculoskeletal: Negative.   Skin: Negative.   Neurological: Negative.   Endo/Heme/Allergies: Negative.   Psychiatric/Behavioral: Negative.      Physical Exam: BP (!) 131/95   Pulse 86   Temp 98.6 F (37 C) (Oral)   Resp 15   Ht 5\' 3"  (1.6 m)   Wt 104.8 kg   LMP 11/20/2019   BMI 40.93 kg/m   Physical Exam Vitals and nursing note reviewed. Exam conducted with a chaperone present.  HENT:     Head:  Normocephalic and atraumatic.     Nose: Nose normal.     Mouth/Throat:     Mouth: Mucous membranes are moist.  Eyes:     Extraocular Movements: Extraocular movements intact.     Pupils: Pupils are equal, round, and reactive to light.  Cardiovascular:     Rate and Rhythm: Normal rate and regular rhythm.     Pulses: Normal pulses.     Heart sounds: Normal heart sounds.  Pulmonary:     Effort: Pulmonary effort is normal.     Breath sounds: Normal breath sounds.  Abdominal:     General: Bowel sounds are normal.  Genitourinary:    Comments: Pelvis adequate proven to 7lbs.  Musculoskeletal:        General: Normal range of motion.     Cervical back: Normal range of motion and neck supple.  Skin:    General: Skin is warm and dry.     Capillary Refill: Capillary refill takes less than 2 seconds.  Neurological:     General: No focal deficit present.     Mental Status: She is alert.  Psychiatric:        Mood and Affect: Mood normal.        Behavior: Behavior normal.      NST: FHR baseline 120 bpm, Variability: moderate, Accelerations:present, Decelerations:  Absent= Cat 1/Reactive UC:   irregular, every 2-4 minutes SVE:   Dilation: 1 Effacement (%): 50 Station: -3 Exam by:: [email or web address] RN, vertex verified by fetal sutures.  Leopold's: Position vertex, EFW 7.5lbs via leopold's.   Labs: Results for orders placed or performed during the hospital encounter of 09/02/20 (from the past 24 hour(s))  CBC     Status: Abnormal   Collection Time: 09/02/20 12:16 AM  Result Value Ref Range   WBC 6.8 4.0 - 10.5 K/uL   RBC 4.04 3.87 - 5.11 MIL/uL   Hemoglobin 9.9 (L) 12.0 - 15.0 g/dL   HCT 09/04/20 (L) 36 - 46 %   MCV 78.0 (L) 80.0 - 100.0 fL   MCH 24.5 (L) 26.0 - 34.0 pg   MCHC 31.4 30.0 - 36.0 g/dL   RDW 08.1 44.8 - 18.5 %   Platelets 254 150 - 400 K/uL   nRBC 0.0 0.0 - 0.2 %  Type and screen [facility]     Status: None   Collection Time: 09/02/20 12:49 AM  Result  Value Ref Range   ABO/RH(D) O POS    Antibody Screen NEG    Sample Expiration      09/05/2020,2359 Performed at [facility], [address] [place], [place]     Imaging:  No  results found.  MAU Course: Orders Placed This Encounter  Procedures  . CBC  . RPR  . Diet clear liquid Room service appropriate? Yes; Fluid consistency: Thin  . Vitals signs per unit policy  . Notify Physician  . Fetal monitoring per unit policy  . Activity as tolerated  . Cervical Exam  . Measure blood pressure post delivery every 15 min x 1 hour then every 30 min x 1 hour  . Fundal check post delivery every 15 min x 1 hour then every 30 min x 1 hour  . If Rapid HIV test positive or known HIV positive: initiate AZT orders  . May in and out cath x 2 for inability to void  . Insert foley catheter  . Discontinue foley prior to vaginal delivery  . Initiate Carrier Fluid Protocol  . Initiate Oral Care Protocol  . Order Rapid HIV per protocol if no results on chart  . Patient may have epidural placement upon request  . Evaluate fetal heart rate to establish reassuring pattern prior to initiating Cytotec or Pitocin  . Perform a cervical exam prior to initiating Cytotec or Pitocin  . Discontinue Pitocin if tachysystole with non-reassuring FHR is present  . Nofify MD/CNM if tachysystole with non-reassuring FHR is present  . Initiate intrauterine resuscitation if tachysystole with non-reasuring FHR is present  . If tachysystole WITH reassuring FHR present notify MD / CNM  . May administer Terbutaline 0.25 mg SQ x 1 dose if tachysystole with non-reassuring FHR is presesnt  . Labor Induction  . Evaluate fetal heart rate to establish reassuring pattern prior to initiating Cytotec or Pitocin  . Perform a cervical exam prior to initiating Cytotec or Pitocin  . Discontinue Pitocin if tachysystole with non-reassuring FHR is present  . Nofify MD/CNM if tachysystole with non-reassuring FHR is present   . Initiate intrauterine resuscitation if tachysystole with non-reasuring FHR is present  . If tachysystole WITH reassuring FHR present notify MD / CNM  . May administer Terbutaline 0.25 mg SQ x 1 dose if tachysystole with non-reassuring FHR is presesnt  . Labor Induction  . Full code  . Type and screen [facility]  . Insert and maintain IV Line  . Admit to Inpatient (patient's expected length of stay will be greater than 2 midnights or inpatient only procedure)   Meds ordered this encounter  Medications  . lactated ringers infusion  . oxytocin (PITOCIN) IV BOLUS FROM BAG  . oxytocin (PITOCIN) IV infusion 30 units in NS 500 mL - Premix  . lactated ringers infusion 500-1,000 mL  . acetaminophen (TYLENOL) tablet 650 mg  . oxyCODONE-acetaminophen (PERCOCET/ROXICET) 5-325 MG per tablet 1 tablet  . oxyCODONE-acetaminophen (PERCOCET/ROXICET) 5-325 MG per tablet 2 tablet  . ondansetron (ZOFRAN) injection 4 mg  . sodium citrate-citric acid (ORACIT) solution 30 mL  . lidocaine (PF) (XYLOCAINE) 1 % injection 30 mL  . terbutaline (BRETHINE) injection 0.25 mg  . DISCONTD: misoprostol (CYTOTEC) tablet 25 mcg  . terbutaline (BRETHINE) injection 0.25 mg  . oxytocin (PITOCIN) IV infusion 30 units in NS 500 mL - Premix    Order Specific Question:   Begin infusion at:    Answer:   1 milli-unit/min (1 mL/hr)    Order Specific Question:   Increase infusion by:    Answer:   1 milli-unit/min (1 mL/hr)  . penicillin G potassium 5 Million Units in sodium chloride 0.9 % 250 mL IVPB    Order Specific Question:   Antibiotic Indication:  Answer:   Group B Strep Prophylaxis  . penicillin G potassium 3 Million Units in dextrose 50mL IVPB    Order Specific Question:   Antibiotic Indication:    Answer:   Group B Strep Prophylaxis    Assessment/Plan: Kimberly Day is a 29 y.o. female, G2P1001, IUP at 6441 weeks, presenting for IOL for postdates with TOLAC. Pt endorse + Fm. Denies vaginal  leakage. Denies vaginal bleeding. Denies feeling cxt's. Group B Streptococcus carrier will tx. History of cesarean section (2017, with [facility] for FTP (4 cm, baby 6+5), Dr. Charlotta Newtonzan for [facility], desires VBAC, consent signed.). Polydactyly of fingers. Small for gestational age fetus (11th%ile at 20 weeks, f/u scheduled in 4 weeks.). Low risk female. Anemai (Hgb 10.9 at NOB, recommended Fe). Rubella immune, AA Neg HIV, Neg hep b, rpr NR, cg/g neg, hgb NOV 11.3, at 28 weeks 10.6. Started pnc at [facility]ov at 13.3 weeks, and last was 40.6 weeks. Normal GTT.   FWB: Cat 1 Fetal Tracing.   Plan: Admit to Birthing Suite per consult with Dr Su Hiltoberts Routine [facility] orders Pain med/epidural prn PCN G for GBS prophylaxis  Low dose pitocin for cervical ripening.  Anticipate labor progression   Dale DurhamJade Dameian Crisman NP-C, CNM, MSN 09/02/2020, 1:44 AM

## 2020-09-02 NOTE — Progress Notes (Signed)
Subjective:    Comfortable w/ epidural. Reports mild discomfort on right side w/ ctx. Advised repositioning on right side and to use PCEA button. Discussed use of IUPC for improved assessment of ctx and Pitocin management and pt agrees.  Objective:    VS: BP 122/76   Pulse (!) 102   Temp 97.9 F (36.6 C)   Resp 16   Ht 5\' 3"  (1.6 m)   Wt 104.8 kg   LMP 11/20/2019   SpO2 99%   BMI 40.93 kg/m  FHR : baseline 145 / variability moderate / accelerations present / absent decelerations IUPC: contractions every 3 minutes  Membranes: AROM x 4 hrs Dilation: 6 Effacement (%): 70 Cervical Position: Posterior Station: -2 Presentation: Vertex Exam by:: [email or web address] Pitocin 14 mU/min  Assessment/Plan:   29 y.o. G2P1001 [redacted]w[redacted]d IOL for dates Hx Primary C/S for arrest of dilation @ 4 cm and chorio Desires TOLAC  Labor: Progressing normally, S/P cervical balloon and low-dose Pitocin Preeclampsia:  CMP normal, PCR 0.15 Fetal Wellbeing:  Category I Pain Control:  Epidural I/D:  GBS pos, adequate PCN prophylaxis Anticipated MOD:  NSVD  [redacted]w[redacted]d MSN, CNM 09/02/2020 10:20 PM

## 2020-09-03 ENCOUNTER — Encounter (HOSPITAL_COMMUNITY): Payer: Self-pay | Admitting: Obstetrics and Gynecology

## 2020-09-03 DIAGNOSIS — O34219 Maternal care for unspecified type scar from previous cesarean delivery: Secondary | ICD-10-CM | POA: Diagnosis not present

## 2020-09-03 DIAGNOSIS — O411290 Chorioamnionitis, unspecified trimester, not applicable or unspecified: Secondary | ICD-10-CM | POA: Diagnosis not present

## 2020-09-03 DIAGNOSIS — O139 Gestational [pregnancy-induced] hypertension without significant proteinuria, unspecified trimester: Secondary | ICD-10-CM | POA: Diagnosis not present

## 2020-09-03 MED ORDER — SIMETHICONE 80 MG PO CHEW: 80.0000 mg | CHEWABLE_TABLET | ORAL | Status: DC | PRN

## 2020-09-03 MED ORDER — AMMONIA AROMATIC IN INHA
RESPIRATORY_TRACT | Status: AC
  Filled 2020-09-03: qty 10

## 2020-09-03 MED ORDER — TETANUS-DIPHTH-ACELL PERTUSSIS 5-2.5-18.5 LF-MCG/0.5 IM SUSP: 0.5000 mL | Freq: Once | INTRAMUSCULAR | Status: DC

## 2020-09-03 MED ORDER — ONDANSETRON HCL 4 MG PO TABS: 4.0000 mg | ORAL_TABLET | ORAL | Status: DC | PRN

## 2020-09-03 MED ORDER — COCONUT OIL OIL: 1.0000 "application " | TOPICAL_OIL | Status: DC | PRN

## 2020-09-03 MED ORDER — WITCH HAZEL-GLYCERIN EX PADS: 1.0000 "application " | MEDICATED_PAD | CUTANEOUS | Status: DC | PRN

## 2020-09-03 MED ORDER — ZOLPIDEM TARTRATE 5 MG PO TABS: 5.0000 mg | ORAL_TABLET | Freq: Every evening | ORAL | Status: DC | PRN

## 2020-09-03 MED ORDER — IBUPROFEN 600 MG PO TABS
600.0000 mg | ORAL_TABLET | Freq: Four times a day (QID) | ORAL | Status: DC
  Administered 2020-09-03 – 2020-09-04 (×5): 600 mg via ORAL
  Filled 2020-09-03 (×5): qty 1

## 2020-09-03 MED ORDER — SENNOSIDES-DOCUSATE SODIUM 8.6-50 MG PO TABS
2.0000 | ORAL_TABLET | ORAL | Status: DC
  Administered 2020-09-04: 2 via ORAL
  Filled 2020-09-03: qty 2

## 2020-09-03 MED ORDER — ACETAMINOPHEN 500 MG PO TABS
1000.0000 mg | ORAL_TABLET | Freq: Once | ORAL | Status: AC
  Administered 2020-09-03: 1000 mg via ORAL
  Filled 2020-09-03: qty 2

## 2020-09-03 MED ORDER — NIFEDIPINE ER OSMOTIC RELEASE 30 MG PO TB24
30.0000 mg | ORAL_TABLET | Freq: Every day | ORAL | Status: DC
  Administered 2020-09-03 – 2020-09-04 (×2): 30 mg via ORAL
  Filled 2020-09-03 (×2): qty 1

## 2020-09-03 MED ORDER — PRENATAL MULTIVITAMIN CH
1.0000 | ORAL_TABLET | Freq: Every day | ORAL | Status: DC
  Administered 2020-09-03 – 2020-09-04 (×2): 1 via ORAL
  Filled 2020-09-03 (×2): qty 1

## 2020-09-03 MED ORDER — DIBUCAINE (PERIANAL) 1 % EX OINT: 1.0000 "application " | TOPICAL_OINTMENT | CUTANEOUS | Status: DC | PRN

## 2020-09-03 MED ORDER — SODIUM CHLORIDE 0.9 % IV SOLN
3.0000 g | Freq: Four times a day (QID) | INTRAVENOUS | Status: AC
  Administered 2020-09-03 – 2020-09-04 (×3): 3 g via INTRAVENOUS
  Filled 2020-09-03 (×3): qty 3

## 2020-09-03 MED ORDER — ONDANSETRON HCL 4 MG/2ML IJ SOLN: 4.0000 mg | INTRAMUSCULAR | Status: DC | PRN

## 2020-09-03 MED ORDER — ACETAMINOPHEN 325 MG PO TABS
650.0000 mg | ORAL_TABLET | ORAL | Status: DC | PRN
  Filled 2020-09-03: qty 2

## 2020-09-03 MED ORDER — DIPHENHYDRAMINE HCL 25 MG PO CAPS: 25.0000 mg | ORAL_CAPSULE | Freq: Four times a day (QID) | ORAL | Status: DC | PRN

## 2020-09-03 MED ORDER — BENZOCAINE-MENTHOL 20-0.5 % EX AERO
1.0000 "application " | INHALATION_SPRAY | CUTANEOUS | Status: DC | PRN
  Administered 2020-09-03 – 2020-09-04 (×2): 1 via TOPICAL
  Filled 2020-09-03 (×2): qty 56

## 2020-09-03 MED ORDER — FAMOTIDINE 20 MG PO TABS
20.0000 mg | ORAL_TABLET | Freq: Every day | ORAL | Status: DC
  Administered 2020-09-04: 20 mg via ORAL
  Filled 2020-09-03: qty 1

## 2020-09-03 MED ORDER — SODIUM CHLORIDE 0.9 % IV SOLN
3.0000 g | Freq: Four times a day (QID) | INTRAVENOUS | Status: DC
  Administered 2020-09-03 (×2): 3 g via INTRAVENOUS
  Filled 2020-09-03: qty 3
  Filled 2020-09-03 (×4): qty 8

## 2020-09-03 NOTE — Progress Notes (Signed)
Subjective:    Pt reported increased, constant rectal pressure @ 0500. Pushing initiated w/ a strong urge. Decent of vertex from +1 to +2 after 85 min of pushing in varying positions. Pt to labor down in throne position.   Objective:    VS: BP 98/68    Pulse (!) 141    Temp 100.1 F (37.8 C) (Oral)    Resp 15    Ht 5\' 3"  (1.6 m)    Wt 104.8 kg    LMP 11/20/2019    SpO2 99%    BMI 40.93 kg/m  FHR : baseline 140 / variability moderate / accelerations present / early decelerations IUPC removed, ctx Q 3-4.5 min Membranes: AROM x12 hrs  Dilation: 10 Dilation Complete Date: 09/03/20 Dilation Complete Time: 0507 Effacement (%): 100 Cervical Position: Posterior Station: 0, Plus 1 Presentation: Vertex Exam by:: [email or web address] CNM Pitocin 21 mU/min  Assessment/Plan:   29 y.o. G2P1001 [redacted]w[redacted]d IOL for dates HxPrimary C/S for arrest of dilation @ 4 cm and chorio Desires TOLAC  Suspect chorio    -afebrile after treatment    -S/P acetaminophen 1G PO, LR 500 ml bolus    -cont Unasyn 3G Q6 PP   Labor: adequate ctx, will resume pushing w/ on-coming CNM Preeclampsia:  no signs or symptoms of toxicity and intake and ouput balanced Fetal Wellbeing:  Category I Pain Control:  Epidural I/D:  GBS pos, adequate PCN prophylaxis Anticipated MOD:  NSVD  [redacted]w[redacted]d MSN, CNM 09/03/2020 7:37 AM

## 2020-09-03 NOTE — Lactation Note (Signed)
This note was copied from a baby's chart. Lactation Consultation Note  Patient Name: Kimberly Day [id number] Date: 09/03/2020   Mom is a G2.  Reports only breastfed her last baby while they were in the hospital.  Mom reports she will be going back to work so has a breastpump for home use.   Infant swaddled in moms arms on arrival.. Discussed STS.  Mom reports she was doing it until just now. Swaddled him so she could lay down. Urged to feed on cue and 8-12 times day.  Urged hand expression and spoon feeding if infant too sleepy to latch. 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  Mom repots that's what they just did.  Mom asked about pumping.  Discussed normal newborn behavior past epidural.  Praised breastfeeding efforts.Urged to keep STS as much as possible  Urged to call lactation as needed. Reviewed and left [facility] servic[facility] handout. .    Maternal Data    Feeding    LATCH Score Latch: Repeated attempts needed to sustain latch, nipple held in mouth throughout feeding, stimulation needed to elicit sucking reflex.  Audible Swallowing: None  Type of Nipple: Everted at rest and after stimulation  Comfort (Breast/Nipple): Soft / non-tender  Hold (Positioning): Assistance needed to correctly position infant at breast and maintain latch.  LATCH Score: 6  Interventions Interventions: Breast feeding basics reviewed;Assisted with latch;Skin to skin;Hand express;Adjust position;Support pillows;Position options  Lactation Tools Discussed/Used     Consult Status      [name] 09/03/2020, 6:05 PM

## 2020-09-03 NOTE — Anesthesia Postprocedure Evaluation (Signed)
Anesthesia Post Note  Patient: Kimberly Day  Procedure(s) Performed: AN AD HOC LABOR EPIDURAL     Patient location during evaluation: Mother Baby Anesthesia Type: Epidural Level of consciousness: awake and alert Pain management: pain level controlled Vital Signs Assessment: post-procedure vital signs reviewed and stable Respiratory status: spontaneous breathing, nonlabored ventilation and respiratory function stable Cardiovascular status: stable Postop Assessment: no headache, no backache and epidural receding Anesthetic complications: no   No complications documented.  Last Vitals:  Vitals:   09/03/20 1002 09/03/20 1102  BP: (!) 135/91 122/87  Pulse: 99 97  Resp: 17 17  Temp: 37.5 C 37.1 C  SpO2: 99% 99%    Last Pain:  Vitals:   09/03/20 1102  TempSrc: Oral  PainSc: 0-No pain   Pain Goal:                   Kimberly Day

## 2020-09-03 NOTE — Lactation Note (Signed)
This note was copied from a baby's chart. Lactation Consultation Note  Patient Name: Kimberly Day [id number] Date: 09/03/2020 Reason for consult: Initial assessment;Term  Visited with mom of a 10 hours old FT female, she's a P2 but not experienced with BF. She told LC that she tried BF her first child the first 2-3 days but didn't succeed because baby would only latch on to the left side and it was painful, so she just switched to formula, but she did "feel" it when her milk came in. However, she's more prepared this time, she has a DEBP at home in case baby has difficulties latching when leaving the hospital.  [facility] familiar with hand expression, and has been doing so since 36 weeks, and able to get colostrum ever since. LC revised hand expression with mom and she was able to do teach back and get colostrum also, praised her for her efforts. Noticed that her right nipple is slightly more short shafted than the left one, the left breast is also more compressible. Mom said her nipples have been sensitive throughout the whole pregnancy but she didn't report any pain or discomfort when doing hand expression at this point.  Offered assistance with latch but mom politely declined; she said she just tried to feed baby about an hour ago, baby was asleep. Asked mom to call for assistance when needed. Reviewed normal newborn behavior, feeding cues, cluster feeding, lactogenesis II and size of baby's stomach.  Feeding plan:  1. Encouraged mom to feed baby STS 8-12 times/24 hours or sooner if feeding cues are present 2. Hand expression and spoon feeding were also encouraged, especially if baby is still having difficulties latching on  BF brochure, BF resources and feeding diary were reviewed. No support person in mom's room at the time of [facility] consultation. Mom reported all questions and concerns were answered, she's aware of LC OP services and will call PRN.   Maternal Data Formula Feeding for  Exclusion: No Has patient been taught Hand Expression?: Yes Does the patient have breastfeeding experience prior to this delivery?: Yes  Feeding    LATCH Score Latch: Repeated attempts needed to sustain latch, nipple held in mouth throughout feeding, stimulation needed to elicit sucking reflex.  Audible Swallowing: None  Type of Nipple: Everted at rest and after stimulation  Comfort (Breast/Nipple): Soft / non-tender  Hold (Positioning): Assistance needed to correctly position infant at breast and maintain latch.  LATCH Score: 6  Interventions Interventions: Breast feeding basics reviewed;Breast massage;Hand express;Breast compression  Lactation Tools Discussed/Used     Consult Status Consult Status: Follow-up Date: 09/04/20 Follow-up type: In-patient    Kimberly Day 09/03/2020, 6:38 PM

## 2020-09-03 NOTE — Lactation Note (Signed)
This note was copied from a baby's chart. Lactation Consultation Note  Patient Name: Kimberly Day [id number] Date: 09/03/2020 Reason for consult: Follow-up assessment;Term;Mother's request  RN called LC for assistance due to difficult latch, baby is now 32 hours old and still hasn't latch consistently, he's only been able to get feeding through hand expression/finger feeding. RN also reported to [facility] that baby had a tongue tied, when LC came in the room and assessed baby's suck noticed that he had a very tight grip with barely no sucking, just biting/clamping, with curling of the tongue when trying to suck on a gloved finger.   Offered assistance with latch but mom told that she just tried feeding baby about an hour ago and baby got too upset because he kept cueing but he can't latch yet, so RN assisted mom with hand expression and offered colostrum on the last feeding. Mom not willing to try another feeding at the breast at this time, suggested to start pumping to start getting consistent breast stimulation for the onset of lactogenesis II, mom agreed.  Set up a DEBP, instructions, cleaning and storage were reviewed, as well as milk storage guidelines. Mom started pumping during [facility] consultation, praised her for her efforts. Mom still pumping when exiting the room. Discussed with mom the possible use of a NS tomorrow if baby is still not latching on. Mom voiced that she was planning on pumping anyway because she's going back to work full time and baby will be in daycare.  Feeding plan:  1. Encouraged mom to keep putting baby to breast for feedings/feeding attempts and keep him STS if he's still not latching. Will reassess tomorrow with NS, no lactation coverage tonight.  2. Hand expression and spoon feeding were also encouraged, especially if baby is still having difficulties latching on 3. Mom will start pumping every 3 hours, ideally 8 pumping sessions/24 hours. She'll offer any amount of EBM  she may get to baby  No support person in mom's room at the time of [facility] consultation. Mom reported all questions and concerns were answered, she's aware of LC OP services and will call PRN.   Maternal Data    Feeding    LATCH Score                   Interventions Interventions: Breast feeding basics reviewed;DEBP;Breast compression;Hand express;Breast massage  Lactation Tools Discussed/Used Tools: Pump;Flanges Flange Size: 24 Breast pump type: Double-Electric Breast Pump Pump Review: Setup, frequency, and cleaning;Milk Storage Initiated by:: MPeck Date initiated:: 09/03/20   Consult Status Consult Status: Follow-up Date: 09/04/20 Follow-up type: In-patient    Marice Guidone [name] 09/03/2020, 11:10 PM

## 2020-09-03 NOTE — Progress Notes (Signed)
S: RN called and reported elevated BP, pt denies HA, RUQ pain or vision changes.   O: Patient Vitals for the past 24 hrs:  BP Temp Temp src Pulse Resp SpO2  09/03/20 1552 (!) 141/95 98.2 F (36.8 C) Oral 78 18 100 %  09/03/20 1102 122/87 98.8 F (37.1 C) Oral 97 17 99 %  09/03/20 1002 (!) 135/91 99.5 F (37.5 C) Oral 99 17 99 %  09/03/20 0931 135/89 -- -- 97 18 --  09/03/20 0916 136/83 -- -- 99 16 --  09/03/20 0900 135/87 99.9 F (37.7 C) Oral 100 14 --  09/03/20 0858 140/90 -- -- 98 16 --  09/03/20 0846 (!) 164/100 -- -- (!) 102 18 --  09/03/20 0831 (!) 156/102 -- -- 97 16 --  09/03/20 0824 (!) 157/98 -- -- (!) 110 18 --  09/03/20 0801 134/86 -- -- (!) 152 16 --  09/03/20 0731 (!) 125/104 (!) 100.8 F (38.2 C) Oral (!) 132 18 --  09/03/20 0631 98/68 100.1 F (37.8 C) Oral (!) 141 -- --  09/03/20 0531 132/90 -- -- (!) 143 -- --  09/03/20 0501 135/87 -- -- (!) 106 -- --  09/03/20 0454 -- 99.7 F (37.6 C) Oral -- -- --  09/03/20 0431 127/65 -- -- 81 15 --  09/03/20 0401 120/68 -- -- 84 16 --  09/03/20 0331 127/64 -- -- 86 15 --  09/03/20 0321 -- (!) 100.4 F (38 C) Oral -- -- --  09/03/20 0231 (!) 156/84 -- -- 83 16 --  09/03/20 0219 (!) 151/84 -- -- 91 16 --  09/03/20 0145 -- (!) 100.4 F (38 C) Oral -- -- --  09/03/20 0131 129/75 -- -- 96 -- --  09/03/20 0100 134/63 -- -- 91 16 --  09/03/20 0031 (!) 153/78 -- -- 96 15 --  09/03/20 0004 (!) 146/74 (!) 100.8 F (38.2 C) Oral (!) 107 15 --  09/02/20 2331 (!) 147/88 -- -- 94 -- --  09/02/20 2322 -- 100.3 F (37.9 C) Oral -- -- --  09/02/20 2301 (!) 157/95 -- -- 97 -- --  09/02/20 2244 -- 99.8 F (37.7 C) Oral -- -- --  09/02/20 2231 (!) 143/107 -- -- 99 -- --  09/02/20 2201 122/76 -- -- (!) 102 -- --  09/02/20 2137 -- 98.3 F (36.8 C) Oral -- -- --  09/02/20 2131 120/68 -- -- 92 16 --  09/02/20 2101 (!) 140/96 -- -- (!) 122 -- --  09/02/20 2015 -- -- -- -- -- 99 %  09/02/20 2010 -- -- -- -- -- 98 %  09/02/20 2005  -- -- -- -- -- 99 %  09/02/20 2001 (!) 130/93 -- -- (!) 129 -- --  09/02/20 2000 -- -- -- -- -- 98 %  09/02/20 1956 128/86 -- -- (!) 134 -- --  09/02/20 1955 -- -- -- -- -- 98 %  09/02/20 1931 (!) 152/94 97.9 F (36.6 C) -- 95 16 --  09/02/20 1900 -- -- -- -- 18 100 %  09/02/20 1859 (!) 144/76 -- -- 90 -- 100 %  09/02/20 1855 -- -- -- -- -- 100 %  09/02/20 1850 -- -- -- -- -- 100 %  09/02/20 1840 -- -- -- -- -- 100 %  09/02/20 1835 -- -- -- -- -- 99 %  09/02/20 1831 131/66 98.6 F (37 C) Oral 81 18 --  09/02/20 1830 -- -- -- -- -- 100 %  09/02/20 1825 -- -- -- -- --  98 %  09/02/20 1820 -- -- -- -- -- 100 %  09/02/20 1805 -- -- -- -- -- 100 %  09/02/20 1801 140/70 -- -- 73 18 --  09/02/20 1800 -- -- -- -- -- 100 %  09/02/20 1745 -- -- -- -- -- 100 %  09/02/20 1730 120/62 -- -- 71 18 100 %  09/02/20 1700 140/75 -- -- 87 18 99 %  09/02/20 1655 133/83 -- -- 89 18 100 %  09/02/20 1650 (!) 141/89 -- -- 81 18 99 %  09/02/20 1646 136/90 -- -- 91 18 --  09/02/20 1645 -- -- -- -- -- 100 %  09/02/20 1640 138/90 -- -- 89 18 100 %  09/02/20 1635 134/89 -- -- 94 18 99 %   A/P: Pt now meets criteria for GHTN, pt newly dx, yesterday labs PCR was 0.15, WNL AST/ALT, creatinine 0.75, plat 254, hgb 9.9, labs placed for morning CBC and CMP, and start pt on procardia 30mg  XL daily. Dr updated.

## 2020-09-03 NOTE — Progress Notes (Signed)
Subjective:    Reports feeling vaginal pressure w/ ctx.   Objective:    VS: BP 134/63   Pulse 91   Temp (!) 100.8 F (38.2 C) (Oral)   Resp 16   Ht 5\' 3"  (1.6 m)   Wt 104.8 kg   LMP 11/20/2019   SpO2 99%   BMI 40.93 kg/m  FHR : baseline 140 / variability moderate / accelerations present / early decelerations IUPC: contractions every 2-3 minutes / MVU 160-180 Membranes: AROM x6 hrs Dilation: 8 Effacement (%): 80 Cervical Position: Posterior Station: -2 Presentation: Vertex Exam by:: [email or web address] CNM Pitocin 16 mU/min  Assessment/Plan:   29 y.o. G2P1001 [redacted]w[redacted]d IOL for dates HxPrimary C/S for arrest of dilation @ 4 cm and chorio Desires TOLAC  Suspect chorio    -temp 100.8    -acetaminophen 1G PO, LR 500 ml bolus    -adding Unasyn 3G Q6   Labor: No cervical change, will increase Pitocin to achieve MVU 180-200 Preeclampsia:  no signs or symptoms of toxicity and labs stable Fetal Wellbeing:  Category I Pain Control:  Epidural I/D:  GBS pos, adequate PCN prophylaxis Anticipated MOD:  hopeful for SVD  [redacted]w[redacted]d MSN, CNM 09/03/2020 1:22 AM

## 2020-09-04 ENCOUNTER — Ambulatory Visit: Payer: Self-pay

## 2020-09-04 DIAGNOSIS — O9902 Anemia complicating childbirth: Secondary | ICD-10-CM | POA: Diagnosis present

## 2020-09-04 LAB — CBC
HCT: 27.7 % — ABNORMAL LOW (ref 36.0–46.0)
Hemoglobin: 8.7 g/dL — ABNORMAL LOW (ref 12.0–15.0)
MCH: 24.2 pg — ABNORMAL LOW (ref 26.0–34.0)
MCHC: 31.4 g/dL (ref 30.0–36.0)
MCV: 76.9 fL — ABNORMAL LOW (ref 80.0–100.0)
Platelets: 234 10*3/uL (ref 150–400)
RBC: 3.6 MIL/uL — ABNORMAL LOW (ref 3.87–5.11)
RDW: 14.3 % (ref 11.5–15.5)
WBC: 13.2 10*3/uL — ABNORMAL HIGH (ref 4.0–10.5)
nRBC: 0 % (ref 0.0–0.2)

## 2020-09-04 LAB — COMPREHENSIVE METABOLIC PANEL
ALT: 36 U/L (ref 0–44)
AST: 29 U/L (ref 15–41)
Albumin: 2 g/dL — ABNORMAL LOW (ref 3.5–5.0)
Alkaline Phosphatase: 127 U/L — ABNORMAL HIGH (ref 38–126)
Anion gap: 9 (ref 5–15)
BUN: 5 mg/dL — ABNORMAL LOW (ref 6–20)
CO2: 22 mmol/L (ref 22–32)
Calcium: 8.4 mg/dL — ABNORMAL LOW (ref 8.9–10.3)
Chloride: 109 mmol/L (ref 98–111)
Creatinine, Ser: 0.78 mg/dL (ref 0.44–1.00)
GFR calc Af Amer: >60 mL/min (ref >60)
GFR calc non Af Amer: >60 mL/min (ref >60)
Glucose, Bld: 72 mg/dL (ref 70–99)
Potassium: 3.1 mmol/L — ABNORMAL LOW (ref 3.5–5.1)
Sodium: 140 mmol/L (ref 135–145)
Total Bilirubin: 0.3 mg/dL (ref 0.3–1.2)
Total Protein: 5.2 g/dL — ABNORMAL LOW (ref 6.5–8.1)

## 2020-09-04 MED ORDER — MAGNESIUM OXIDE -MG SUPPLEMENT 400 (240 MG) MG PO TABS: 400.0000 mg | ORAL_TABLET | Freq: Every day | ORAL | Status: AC

## 2020-09-04 MED ORDER — IBUPROFEN 600 MG PO TABS: 600.0000 mg | ORAL_TABLET | Freq: Four times a day (QID) | ORAL | 0 refills | Status: AC

## 2020-09-04 MED ORDER — POLYSACCHARIDE IRON COMPLEX 150 MG PO CAPS
150.0000 mg | ORAL_CAPSULE | Freq: Every day | ORAL | Status: DC
  Administered 2020-09-04: 150 mg via ORAL
  Filled 2020-09-04: qty 1

## 2020-09-04 MED ORDER — FAMOTIDINE 20 MG PO TABS: 20.0000 mg | ORAL_TABLET | Freq: Every day | ORAL | Status: AC

## 2020-09-04 MED ORDER — BENZOCAINE-MENTHOL 20-0.5 % EX AERO: 1.0000 "application " | INHALATION_SPRAY | CUTANEOUS | Status: AC | PRN

## 2020-09-04 MED ORDER — ACETAMINOPHEN 500 MG PO TABS: 1000.0000 mg | ORAL_TABLET | Freq: Four times a day (QID) | ORAL | 2 refills | Status: AC | PRN

## 2020-09-04 MED ORDER — COCONUT OIL OIL: 1.0000 "application " | TOPICAL_OIL | 0 refills | Status: AC | PRN

## 2020-09-04 MED ORDER — NIFEDIPINE ER 30 MG PO TB24: 30.0000 mg | ORAL_TABLET | Freq: Every day | ORAL | 0 refills | Status: AC

## 2020-09-04 MED ORDER — POLYSACCHARIDE IRON COMPLEX 150 MG PO CAPS: 150.0000 mg | ORAL_CAPSULE | Freq: Every day | ORAL | Status: AC

## 2020-09-04 MED ORDER — MAGNESIUM OXIDE 400 (241.3 MG) MG PO TABS
400.0000 mg | ORAL_TABLET | Freq: Every day | ORAL | Status: DC
  Administered 2020-09-04: 400 mg via ORAL
  Filled 2020-09-04: qty 1

## 2020-09-04 NOTE — Lactation Note (Signed)
This note was copied from a baby's chart. Lactation Consultation Note Spoke w/mom about her plan for when she goes home. Mom stated she is going to be pumping more and giving her BM and BF as well as giving formula if needed.  Mom isn't pumping consistently. Explained to mom she needs to pump more as if she was feeding the baby to build her milk supply. Mom stated she will pump more when she gets home.  LC asked mom if baby was going to the breast any, mom stated not much because he has a tongue tie. Mom stated she lives out of town and she will go to his Dr. In their town and see what he says to do.  Encouraged mom to increase baby's feeding amount according to hours of age. Mom feels like what she is doing is going well. Explained to mom that she couldn't take Donor milk home and the baby will need formula until her milk supply comes in. Mom stated she knew that. Mom stated she wants him to get Donor milk while he is here.  Patient Name: Kimberly Day [id number] Date: 09/04/2020 Reason for consult: Follow-up assessment;Term   Maternal Data    Feeding    LATCH Score                   Interventions    Lactation Tools Discussed/Used     Consult Status Consult Status: Follow-up Date: 09/05/20 Follow-up type: In-patient    Charyl Dancer 09/04/2020, 11:45 PM

## 2020-09-04 NOTE — Discharge Summary (Addendum)
OB Discharge Summary  Patient Name: Kimberly Day DOB: 1991/09/18 MRN: [id number]  Date of admission: 09/02/2020 Delivering provider: [facility] [place]   Admitting diagnosis: Encounter for induction of labor [Z34.90] Intrauterine pregnancy: [redacted]w[redacted]d     Secondary diagnosis: Patient Active Problem List   Diagnosis Date Noted  . Maternal anemia, with delivery - IDA 09/04/2020  . Chorioamnionitis 09/03/2020  . VBAC, delivered 09/03/2020  . Postpartum care following VBAC 9/20 09/03/2020  . Gestational hypertension 09/03/2020  . Encounter for induction of labor 09/02/2020  . Delayed delivery after SROM (spontaneous rupture of membranes) 07/05/2016   Additional problems:none   Date of discharge: 09/04/2020   Discharge diagnosis: Principal Problem:   Postpartum care following VBAC 9/20 Active Problems:   Encounter for induction of labor   Chorioamnionitis   VBAC, delivered   Gestational hypertension   Maternal anemia, with delivery - IDA                                                              Post partum procedures:none  Augmentation: Pitocin and IP Foley Pain control: Epidural  Laceration:None  Episiotomy:None  Complications: None  Hospital course:  Induction of Labor With Vaginal Delivery   29 y.o. yo [id number] at [redacted]w[redacted]d was admitted to the hospital 09/02/2020 for induction of labor.  Indication for induction: Postdates.  Patient had an uncomplicated labor course as follows: Membrane Rupture Time/Date: 5:45 PM ,09/02/2020   Delivery Method:VBAC, Spontaneous  Episiotomy: None  Lacerations:  None  Details of delivery can be found in separate delivery note.  Patient had a routine postpartum course. Elevated blood pressures noted postpartum day 0 and patient started on Procardia with good response, no severe range BP, and preeclampsia blood work benign. Completed 24 hours of IV antibiotics postpartum due to elevated temp in labor and suspicion of chorio. Remaines afebrile. 24 hrs  since delivery. Patient is discharged home 09/04/20 per patient request of early discharge.  Newborn Data: Birth date:09/03/2020  Birth time:8:16 AM  Gender:Female  Living status:Living  Apgars:9 ,9  Weight:3605 g   Physical exam  Vitals:   09/03/20 2023 09/04/20 0017 09/04/20 0539 09/04/20 0918  BP: 134/80 131/79 134/86 (!) 129/94  Pulse: 84 86 74 67  Resp: 18 18 18    Temp: 98.1 F (36.7 C) 97.9 F (36.6 C) 98 F (36.7 C)   TempSrc: Oral Oral Oral   SpO2:  100%    Weight:      Height:       General: alert, cooperative and no distress Lochia: appropriate Uterine Fundus: firm Incision: N/A Perineum: intact, no edema DVT Evaluation: No cords or calf tenderness. No significant calf/ankle edema. Labs: Lab Results  Component Value Date   WBC 13.2 (H) 09/04/2020   HGB 8.7 (L) 09/04/2020   HCT 27.7 (L) 09/04/2020   MCV 76.9 (L) 09/04/2020   PLT 234 09/04/2020   CMP Latest Ref Rng & Units 09/04/2020  Glucose 70 - 99 mg/dL 72  BUN 6 - 20 mg/dL 5(L)  Creatinine 09/06/2020 - 1.00 mg/dL 7.94  Sodium 8.01 - 655 mmol/L 140  Potassium 3.5 - 5.1 mmol/L 3.1(L)  Chloride 98 - 111 mmol/L 109  CO2 22 - 32 mmol/L 22  Calcium 8.9 - 10.3 mg/dL 374)  Total Protein 6.5 - 8.1 g/dL 8.2(L)  Total Bilirubin 0.3 - 1.2 mg/dL 0.3  Alkaline Phos 38 - 126 U/L 127(H)  AST 15 - 41 U/L 29  ALT 0 - 44 U/L 36   Edinburgh Postnatal Depression Scale Screening Tool 09/04/2020  I have been able to laugh and see the funny side of things. 0  I have looked forward with enjoyment to things. 0  I have blamed myself unnecessarily when things went wrong. 0  I have been anxious or worried for no good reason. 0  I have felt scared or panicky for no good reason. 0  Things have been getting on top of me. 0  I have been so unhappy that I have had difficulty sleeping. 0  I have felt sad or miserable. 0  I have been so unhappy that I have been crying. 0  The thought of harming myself has occurred to me. 0  Edinburgh  Postnatal Depression Scale Total 0   Discharge instruction:  per After Visit Summary,  "Understanding Mother & Baby Care" hospital booklet  After Visit Meds:  Allergies as of 09/04/2020   No Known Allergies     Medication List    STOP taking these medications   ferrous sulfate 325 (65 FE) MG tablet   ranitidine 150 MG tablet Commonly known as: ZANTAC Replaced by: famotidine 20 MG tablet     TAKE these medications   acetaminophen 500 MG tablet Commonly known as: TYLENOL Take 2 tablets (1,000 mg total) by mouth every 6 (six) hours as needed.   benzocaine-Menthol 20-0.5 % Aero Commonly known as: DERMOPLAST Apply 1 application topically as needed for irritation (perineal discomfort).   coconut oil Oil Apply 1 application topically as needed.   famotidine 20 MG tablet Commonly known as: PEPCID Take 1 tablet (20 mg total) by mouth daily. Start taking on: September 05, 2020 Replaces: ranitidine 150 MG tablet   ibuprofen 600 MG tablet Commonly known as: ADVIL Take 1 tablet (600 mg total) by mouth every 6 (six) hours.   iron polysaccharides 150 MG capsule Commonly known as: Ferrex 150 Take 1 capsule (150 mg total) by mouth daily.   Magnesium Oxide 400 (240 Mg) MG Tabs Take 1 tablet (400 mg total) by mouth daily. For prevention of constipation.   multivitamin-prenatal 27-0.8 MG Tabs tablet Take 1 tablet by mouth daily at 12 noon.   NIFEdipine 30 MG 24 hr tablet Commonly known as: ADALAT CC Take 1 tablet (30 mg total) by mouth daily. Start taking on: September 05, 2020            Discharge Care Instructions  (From admission, onward)         Start     Ordered   09/04/20 0000  Discharge wound care:       Comments: Sitz baths 2 times /day with warm water x 1 week. May add herbals: 1 ounce dried comfrey leaf* 1 ounce calendula flowers 1 ounce lavender flowers  Supplies can be found online at [employer] sources at [employer], [employer]  1/2  ounce dried uva ursi leaves 1/2 ounce witch hazel blossoms (if you can find them) 1/2 ounce dried sage leaf 1/2 cup sea salt Directions: Bring 2 quarts of water to a boil. Turn off heat, and place 1 ounce (approximately 1 large handful) of the above mixed herbs (not the salt) into the pot. Steep, covered, for 30 minutes.  Strain the liquid well with a fine mesh strainer, and discard the herb material. Add  2 quarts of liquid to the tub, along with the 1/2 cup of salt. This medicinal liquid can also be made into compresses and peri-rinses.   09/04/20 1038          Diet: iron rich diet  Activity: Advance as tolerated. Pelvic rest for 6 weeks.   Postpartum contraception: Undecided  Newborn Data: Live born female  Birth Weight: 7 lb 15.2 oz (3605 g) APGAR: 9, 9  Newborn Delivery   Birth date/time: 09/03/2020 08:16:00 Delivery type: VBAC, Spontaneous      named [address] Filter Baby Feeding: Bottle and Breast Disposition:home with mother pending pediatrician discharge   Delivery Report:  Review the Delivery Report for details.    Follow up:  Follow-up Information    [facility]. Schedule an appointment as soon as possible for a visit in 1 week(s).   Specialty: Obstetrics and Gynecology Why: Postpartum blood pressure check Contact information: [address] [place] [address] [phone]                Signed: Cipriano Mile, MSN 09/04/2020, 10:39 AM

## 2020-09-05 ENCOUNTER — Ambulatory Visit: Payer: Self-pay

## 2020-09-05 LAB — SURGICAL PATHOLOGY

## 2020-09-05 NOTE — Lactation Note (Signed)
This note was copied from a baby's chart. Lactation Consultation Note  Patient Name: Boy Briell Paulette [id number] Date: 09/05/2020 Reason for consult: Follow-up assessment;Term;Other (Comment);Infant weight loss (post dates, mom aware the baby needs to be fed - presently receiving donor milk from a bottle due to a tongue tie per mom)  Baby is 48 hours old  3 % weight loss , Bili WNL.  As LC entered the room baby in the crib awake and calm, mom sitting in bed .  Per mom has not pumped since yesterday and only getting some milk in the pump pieces. Plans to use her DEBP ( Spectra 2 at home )  [facility] reviewed importance of Supply and demand, and the importance of pumping at least 8 times a day ( 8-12 times in recommended to establish milk supply).  Mom mentioned a tongue tie was identified and baby has never latched well.  Mom mentioned to follow up with her Pedis and obtain a referral.  LC also provided the referral sheet.  Sore nipple and engorgement prevention and tx reviewed.  Storage of breast milk page 45 reviewed.  Mom has the [facility] pamphlet with phone numbers and LC recommended after the tongue release to follow up with [facility] for re-latch.  LC discussed making sure the baby is taking at least 30 - 45 ml per feeding.  Since her milk is not in she will have to supplement with formula.     Maternal Data    Feeding Feeding Type:  (mom plans to feed the baby soon) Nipple Type: Slow - flow  LATCH Score                   Interventions Interventions: Breast feeding basics reviewed;DEBP  Lactation Tools Discussed/Used Tools: Pump Flange Size: 24;27 Breast pump type: Double-Electric Breast Pump Pump Review: Milk Storage   Consult Status Consult Status: Complete Date: 09/05/20    Kathrin Greathouse 09/05/2020, 8:44 AM

## 2020-09-22 IMAGING — US US MFM OB DETAIL+14 WK
1 series · 12 of 28 positions shown · non-contrast
Comparison: none

[Series 1: us mfm ob detail+14 wk · 12 of 94 slices shown]
[im 4/94]
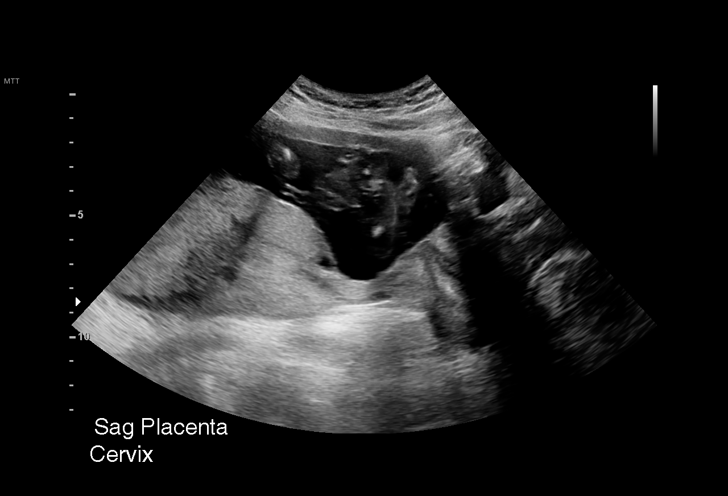
[im 11/94]
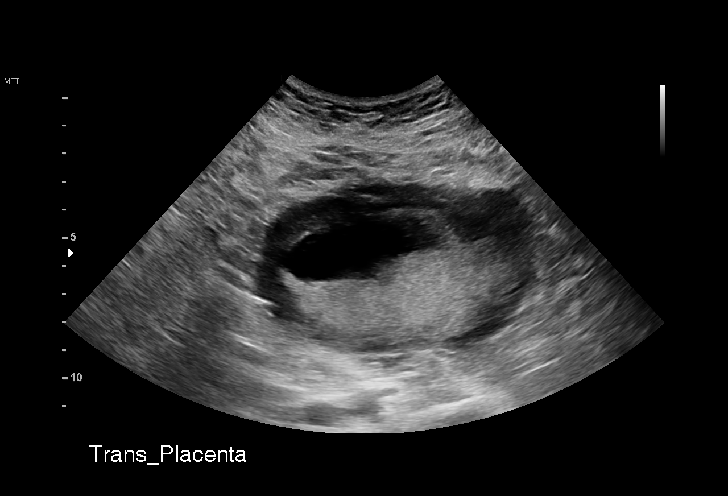
[im 18/94]
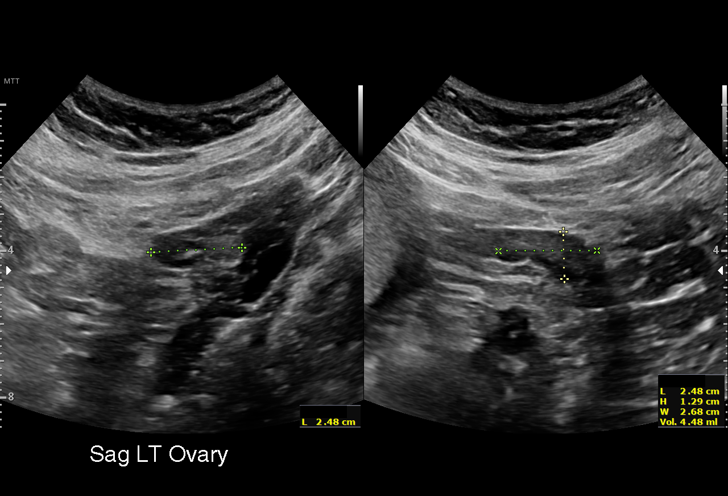
[im 28/94]
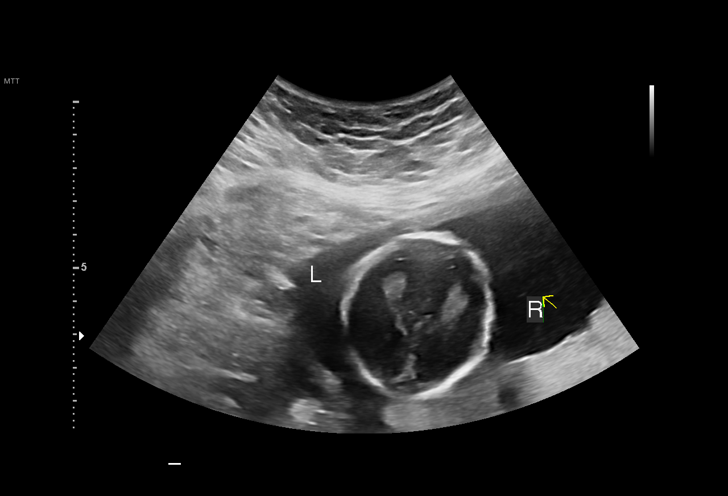
[im 35/94]
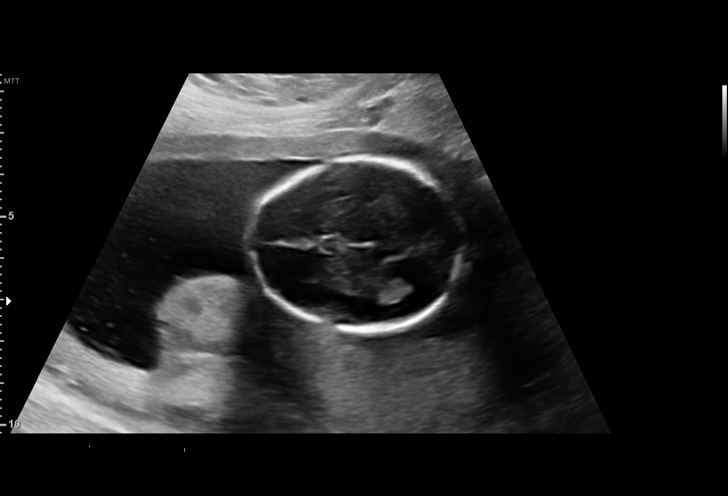
[im 42/94]
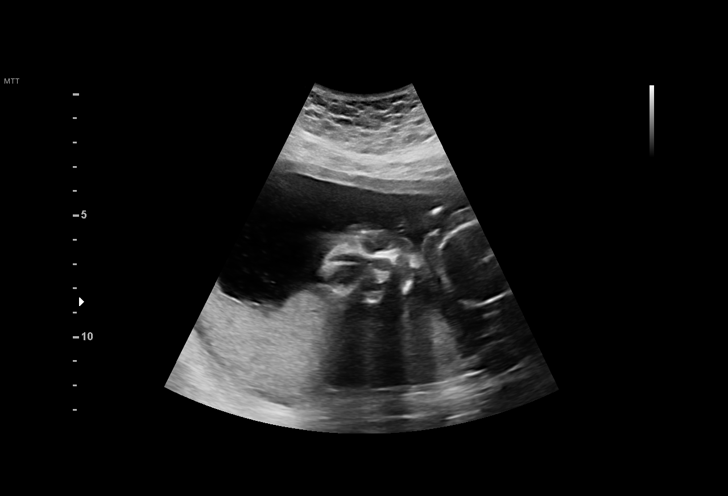
[im 52/94]
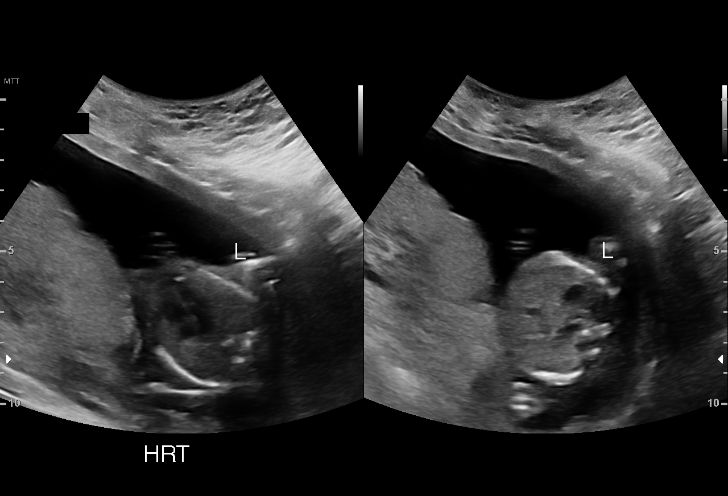
[im 59/94]
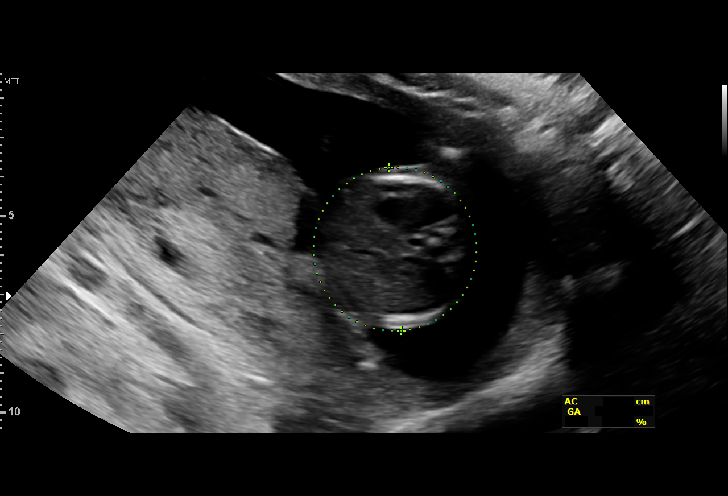
[im 66/94]
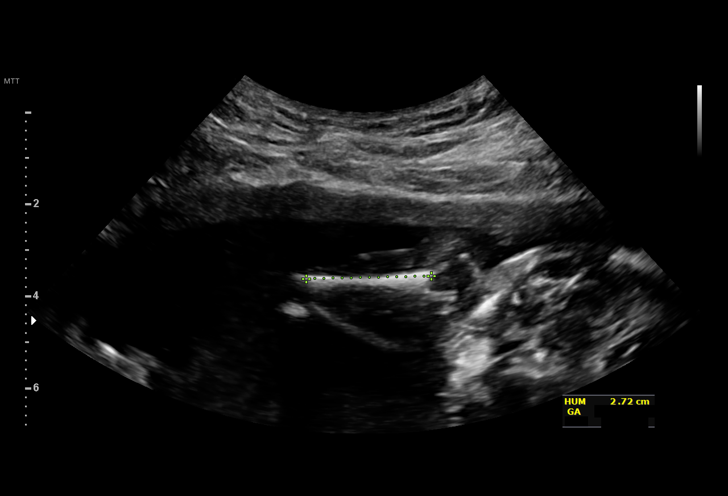
[im 76/94]
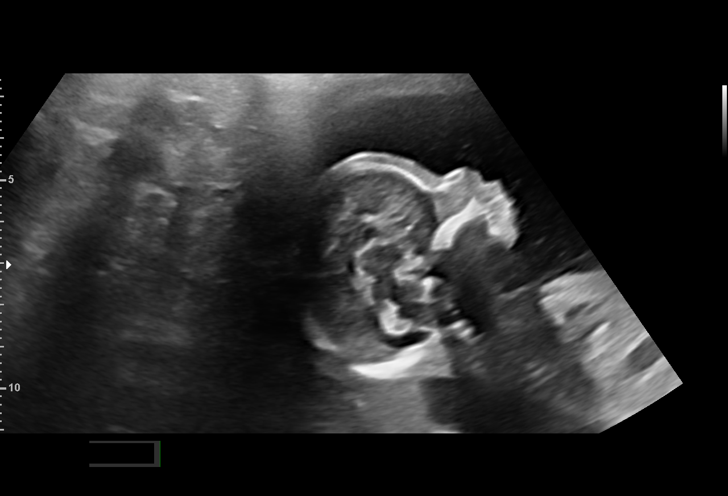
[im 83/94]
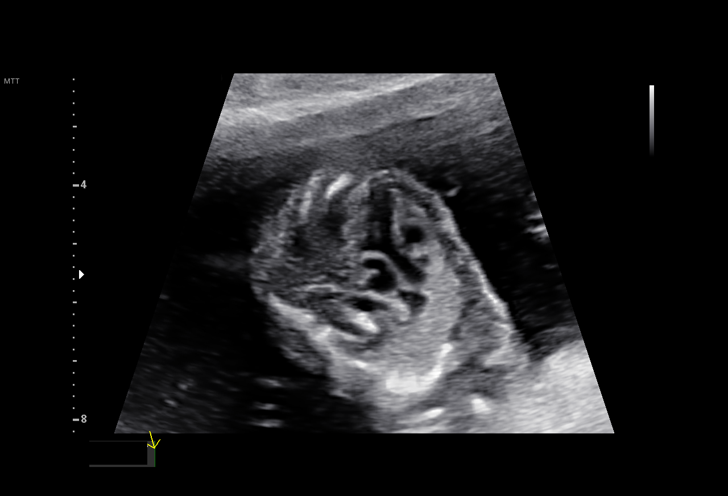
[im 90/94]
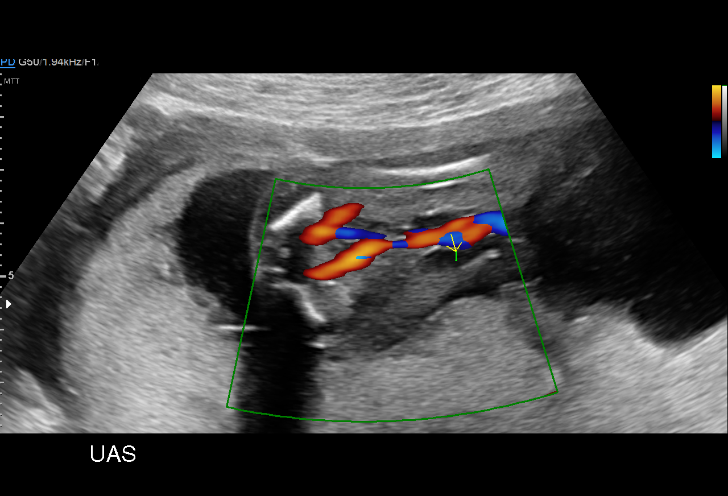

[12 of 28 positions shown; findings below may reference images not displayed]

Obstetrics &
                                                            Gynecology
                                                            [address]
                                                            [place].
                   CNM

 ----------------------------------------------------------------------

 ----------------------------------------------------------------------
Indications

  19 weeks gestation of pregnancy
  Previous cesarean delivery, antepartum
  Anemia during pregnancy in second trimester
  Obesity complicating pregnancy, second
  trimester
 ----------------------------------------------------------------------
Vital Signs

Fetal Evaluation

 Num Of Fetuses:         1
 Fetal Heart Rate(bpm):  142
 Cardiac Activity:       Observed
 Presentation:           Variable
 Placenta:               Posterior
 P. Cord Insertion:      Visualized, central

 Amniotic Fluid
 AFI FV:      Within normal limits

                             Largest Pocket(cm)

Biometry
 BPD:      41.7  mm     G. Age:  18w 4d         23  %    CI:           78   %    70 - 86
                                                         FL/HC:      18.1   %    16.1 -
 HC:      149.4  mm     G. Age:  18w 0d          3  %    HC/AC:      1.12        1.09 -
 AC:      133.2  mm     G. Age:  18w 6d         30  %    FL/BPD:     64.7   %
 FL:         27  mm     G. Age:  18w 2d         11  %    FL/AC:      20.3   %    20 - 24
 HUM:      26.9  mm     G. Age:  18w 4d         31  %
 CER:      17.7  mm     G. Age:  17w 5d          7  %
 NFT:       4.1  mm

 LV:          7  mm
 CM:        3.3  mm

 Est. FW:     243  gm      0 lb 9 oz     11  %
OB History

 Blood Type:    O+
 Gravidity:    2         Term:   1
 Living:       1
Gestational Age

 LMP:           19w 2d        Date:  11/20/19                 EDD:   08/26/20
 U/S Today:     18w 3d                                        EDD:   09/01/20
 Best:          19w 2d     Det. By:  LMP  (11/20/19)          EDD:   08/26/20
Anatomy

 Cranium:               Appears normal         LVOT:                   Appears normal
 Cavum:                 Appears normal         Aortic Arch:            Not well visualized
 Ventricles:            Appears normal         Ductal Arch:            Not well visualized
 Choroid Plexus:        Appears normal         Diaphragm:              Appears normal
 Cerebellum:            Appears normal         Stomach:                Appears normal, left
                                                                       sided
 Posterior Fossa:       Appears normal         Abdomen:                Appears normal
 Nuchal Fold:           Appears normal         Abdominal Wall:         Appears nml (cord
                                                                       insert, abd wall)
 Face:                  Orbits nl; profile not Cord Vessels:           Appears normal (3
                        well visualized                                vessel cord)
 Lips:                  Not well visualized    Kidneys:                Appear normal
 Palate:                Not well visualized    Bladder:                Appears normal
 Thoracic:              Appears normal         Spine:                  Appears normal
 Heart:                 Appears normal         Upper Extremities:      Polydactyly Bilat.
                        (4CH, axis, and si
 RVOT:                  Appears normal         Lower Extremities:      Appears normal

 Other:  Male gender. Heels visualized. Open hands visualized. Technically
         difficult due to maternal habitus and fetal position.
Cervix Uterus Adnexa

 Cervix
 Length:           3.48  cm.
 Normal appearance by transvaginal scan

 Uterus
 No abnormality visualized.

 Left Ovary
 Within normal limits. No adnexal mass visualized.
 Right Ovary
 Within normal limits. No adnexal mass visualized.

 Cul De Sac
 No free fluid seen.

 Adnexa
 No abnormality visualized.
Impression

 Normal anatomy seen today, with exception of isolated
 bilateral polydactyly. In addition , there were suboptimal views
 of the fetal anatomy obtained secondary to fetal position.
 There is good fetal movement and amniotic fluid.

 I discussed today's findings. Ms. Sangbeom reports that she
 and several family members had polyhdactyly at birth.
 Polydactylism, also known as hyperdactyly, is a physical
 condition of having supernumerary fingers or toes, the latter
 being this case. The extra digit is usually a small piece of soft
 tissue that can be removed. Occasionally it contains bone
 without joints; rarely it may be a complete, functioning digit.
 Polydactyly is present in 1/0666 live births and results from
 defective embryologic differentiation of the digits with
 mesodermal rays that persist beyond programmed cell death,
 producing an extra digit. Polydactyly is an isolated finding in
 85% of cases.  Polydactyly is classified as: (1) preaxialextra
 digit located on the radial or tibial side of the distal extremity;
 (2) postaxialextra digit on ulnar or fibular side of distal
 extremity; 10 times more common in African Americans,
 occurring in 1/300 live births (probably an autosomal
 dominant trait) or (3) centrala rare finding, with one or more
 extranumerary middle digits.  The extra digit can be an
 inherited trait, the result of teratogen exposure (e.g., diabetic
 embryopathy, valproic acid), or can be a component of a
 recognizable  group of syndromes. Perinatal morbidity and
 mortality related to an isolated limb defect is low. All
 questions were answered.
Recommendations

 Follow up growth was scheduled in 4 weeks.
